# Patient Record
Sex: Female | Born: 1985 | Race: White | Hispanic: No | Marital: Single | Smoking: Current every day smoker
Health system: Southern US, Community
[De-identification: ages and names within clinical notes are randomized; demographics above are authoritative.]

## PROBLEM LIST (undated history)

## (undated) ENCOUNTER — Emergency Department (HOSPITAL_COMMUNITY): Discharge status: Absent without leave | Payer: BLUE CROSS/BLUE SHIELD

## (undated) DIAGNOSIS — E119 Type 2 diabetes mellitus without complications: Secondary | ICD-10-CM

## (undated) DIAGNOSIS — K5909 Other constipation: Secondary | ICD-10-CM

## (undated) DIAGNOSIS — K219 Gastro-esophageal reflux disease without esophagitis: Secondary | ICD-10-CM

## (undated) DIAGNOSIS — E7800 Pure hypercholesterolemia, unspecified: Secondary | ICD-10-CM

## (undated) DIAGNOSIS — I10 Essential (primary) hypertension: Secondary | ICD-10-CM

## (undated) HISTORY — DX: Other constipation: K59.09

## (undated) HISTORY — PX: APPENDECTOMY: SHX54

## (undated) HISTORY — DX: Gastro-esophageal reflux disease without esophagitis: K21.9

## (undated) HISTORY — DX: Type 2 diabetes mellitus without complications: E11.9

---
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
## 2006-07-20 ENCOUNTER — Emergency Department (HOSPITAL_COMMUNITY): Admission: EM | Admit: 2006-07-20 | Discharge: 2006-07-20 | Payer: Self-pay | Admitting: Emergency Medicine

## 2007-04-25 ENCOUNTER — Emergency Department (HOSPITAL_COMMUNITY): Admission: EM | Admit: 2007-04-25 | Discharge: 2007-04-25 | Payer: Self-pay | Admitting: Emergency Medicine

## 2007-07-19 ENCOUNTER — Emergency Department (HOSPITAL_COMMUNITY): Admission: EM | Admit: 2007-07-19 | Discharge: 2007-07-19 | Payer: Self-pay | Admitting: Emergency Medicine

## 2007-10-13 ENCOUNTER — Emergency Department (HOSPITAL_COMMUNITY): Admission: EM | Admit: 2007-10-13 | Discharge: 2007-10-13 | Payer: Self-pay | Admitting: Emergency Medicine

## 2007-12-27 ENCOUNTER — Emergency Department (HOSPITAL_COMMUNITY): Admission: EM | Admit: 2007-12-27 | Discharge: 2007-12-27 | Payer: Self-pay | Admitting: Emergency Medicine

## 2008-03-23 ENCOUNTER — Emergency Department (HOSPITAL_COMMUNITY): Admission: EM | Admit: 2008-03-23 | Discharge: 2008-03-23 | Payer: Self-pay | Admitting: Emergency Medicine

## 2008-09-27 ENCOUNTER — Emergency Department (HOSPITAL_COMMUNITY): Admission: EM | Admit: 2008-09-27 | Discharge: 2008-09-27 | Payer: Self-pay | Admitting: Emergency Medicine

## 2009-02-08 ENCOUNTER — Emergency Department (HOSPITAL_COMMUNITY): Admission: EM | Admit: 2009-02-08 | Discharge: 2009-02-08 | Payer: Self-pay | Admitting: Emergency Medicine

## 2009-11-09 ENCOUNTER — Emergency Department (HOSPITAL_COMMUNITY): Admission: EM | Admit: 2009-11-09 | Discharge: 2009-11-09 | Payer: Self-pay | Admitting: Emergency Medicine

## 2010-05-27 ENCOUNTER — Encounter: Payer: Self-pay | Admitting: Internal Medicine

## 2010-06-28 ENCOUNTER — Emergency Department (HOSPITAL_COMMUNITY)
Admission: EM | Admit: 2010-06-28 | Discharge: 2010-06-28 | Disposition: A | Discharge status: Home / Self Care | Payer: Self-pay | Attending: Emergency Medicine | Admitting: Emergency Medicine

## 2010-06-28 ENCOUNTER — Emergency Department (HOSPITAL_COMMUNITY): Payer: Self-pay

## 2010-06-28 DIAGNOSIS — R51 Headache: Secondary | ICD-10-CM | POA: Insufficient documentation

## 2010-06-28 DIAGNOSIS — R42 Dizziness and giddiness: Secondary | ICD-10-CM | POA: Insufficient documentation

## 2010-08-10 LAB — URINALYSIS, ROUTINE W REFLEX MICROSCOPIC
Glucose, UA: NEGATIVE mg/dL
Specific Gravity, Urine: 1.025 (ref 1.005–1.030)

## 2010-08-10 LAB — URINE CULTURE

## 2010-08-10 LAB — URINE MICROSCOPIC-ADD ON

## 2011-01-29 LAB — URINALYSIS, ROUTINE W REFLEX MICROSCOPIC
Glucose, UA: NEGATIVE
Hgb urine dipstick: NEGATIVE
Specific Gravity, Urine: 1.025
Urobilinogen, UA: 0.2

## 2011-01-29 LAB — URINE MICROSCOPIC-ADD ON

## 2011-01-29 LAB — URINE CULTURE

## 2011-02-09 LAB — COMPREHENSIVE METABOLIC PANEL
ALT: 27
AST: 19
Albumin: 3.6
Alkaline Phosphatase: 43
BUN: 10
CO2: 28
Calcium: 8.7
Chloride: 104
Creatinine, Ser: 0.7
GFR calc Af Amer: >60
GFR calc non Af Amer: >60
Glucose, Bld: 97
Potassium: 3.8
Sodium: 137
Total Bilirubin: 0.6
Total Protein: 6.6

## 2011-02-09 LAB — DIFFERENTIAL
Basophils Relative: 0
Eosinophils Absolute: 0.4
Eosinophils Relative: 4
Neutrophils Relative %: 58

## 2011-02-09 LAB — LIPASE, BLOOD: Lipase: 48

## 2011-02-09 LAB — URINALYSIS, ROUTINE W REFLEX MICROSCOPIC
Bilirubin Urine: NEGATIVE
Nitrite: NEGATIVE
Specific Gravity, Urine: 1.01
pH: 6.5

## 2011-02-09 LAB — CBC
Hemoglobin: 13.6
MCHC: 34.4
RBC: 4.54
WBC: 10.4

## 2011-02-09 LAB — PREGNANCY, URINE: Preg Test, Ur: NEGATIVE

## 2011-04-12 ENCOUNTER — Other Ambulatory Visit: Payer: Self-pay

## 2011-04-12 ENCOUNTER — Emergency Department (HOSPITAL_COMMUNITY)
Admission: EM | Admit: 2011-04-12 | Discharge: 2011-04-12 | Disposition: A | Discharge status: Home / Self Care | Payer: Self-pay | Attending: Emergency Medicine | Admitting: Emergency Medicine

## 2011-04-12 ENCOUNTER — Emergency Department (HOSPITAL_COMMUNITY): Payer: Self-pay

## 2011-04-12 ENCOUNTER — Encounter: Payer: Self-pay | Admitting: *Deleted

## 2011-04-12 DIAGNOSIS — B9789 Other viral agents as the cause of diseases classified elsewhere: Secondary | ICD-10-CM | POA: Insufficient documentation

## 2011-04-12 DIAGNOSIS — B349 Viral infection, unspecified: Secondary | ICD-10-CM

## 2011-04-12 DIAGNOSIS — Z87891 Personal history of nicotine dependence: Secondary | ICD-10-CM | POA: Insufficient documentation

## 2011-04-12 DIAGNOSIS — I10 Essential (primary) hypertension: Secondary | ICD-10-CM | POA: Insufficient documentation

## 2011-04-12 DIAGNOSIS — R079 Chest pain, unspecified: Secondary | ICD-10-CM | POA: Insufficient documentation

## 2011-04-12 DIAGNOSIS — R0602 Shortness of breath: Secondary | ICD-10-CM | POA: Insufficient documentation

## 2011-04-12 HISTORY — DX: Essential (primary) hypertension: I10

## 2011-04-12 LAB — POCT I-STAT, CHEM 8
Calcium, Ion: 1.13 mmol/L (ref 1.12–1.32)
Chloride: 101 mEq/L (ref 96–112)
Creatinine, Ser: 0.8 mg/dL (ref 0.50–1.10)
Glucose, Bld: 111 mg/dL — ABNORMAL HIGH (ref 70–99)
HCT: 46 % (ref 36.0–46.0)
Potassium: 4.2 mEq/L (ref 3.5–5.1)

## 2011-04-12 LAB — URINE MICROSCOPIC-ADD ON

## 2011-04-12 LAB — URINALYSIS, ROUTINE W REFLEX MICROSCOPIC
Bilirubin Urine: NEGATIVE
Glucose, UA: NEGATIVE mg/dL
Ketones, ur: NEGATIVE mg/dL
Nitrite: NEGATIVE
Protein, ur: NEGATIVE mg/dL
pH: 7 (ref 5.0–8.0)

## 2011-04-12 LAB — POCT PREGNANCY, URINE: Preg Test, Ur: NEGATIVE

## 2011-04-12 MED ORDER — OXYCODONE-ACETAMINOPHEN 5-325 MG PO TABS: 2.0000 | ORAL_TABLET | ORAL | Status: AC | PRN

## 2011-04-12 MED ORDER — PROMETHAZINE HCL 25 MG PO TABS: 25.0000 mg | ORAL_TABLET | Freq: Four times a day (QID) | ORAL | Status: DC | PRN

## 2011-04-12 NOTE — ED Provider Notes (Signed)
History     CSN: [id number] Arrival date & time: 04/12/2011 12:22 PM   First MD Initiated Contact with Patient 04/12/11 1308      Chief Complaint  Patient presents with  . Shortness of Breath  . Chest Pain    (Consider location/radiation/quality/duration/timing/severity/associated sxs/prior treatment) Patient is a 25 y.o. female presenting with shortness of breath and chest pain. The history is provided by the patient.  Shortness of Breath  Associated symptoms include chest pain and shortness of breath. Pertinent negatives include no fever, no sore throat and no cough.  Chest Pain Primary symptoms include shortness of breath and nausea. Pertinent negatives for primary symptoms include no fever, no cough and no vomiting.  Associated symptoms include diaphoresis.    the patient is a 25 year old morbidly obese female, who has hypertension.  She smoked until about a month ago and is on lisinopril hydrochlorothiazide, and oral contraceptive pills.  She complains of intermittent chest pain, and shortness of breath for several days.  She has no chest pain, or shortness breath.  Now.  She also complains of a hot sensation with sweaty palms and nausea.  She denies , vomiting, fevers, chills, cough, urinary tract symptoms.  She has not had URI symptoms.  She denies leg pain or swelling.  She does not want any medications at this time.  Past Medical History  Diagnosis Date  . Hypertension     Past Surgical History  Procedure Date  . Appendectomy     History reviewed. No pertinent family history.  History  Substance Use Topics  . Smoking status: Current Everyday Smoker  . Smokeless tobacco: Not on file  . Alcohol Use: No    OB History    Grav Para Term Preterm Abortions TAB SAB Ect Mult Living                  Review of Systems  Constitutional: Positive for diaphoresis. Negative for fever and chills.  HENT: Negative for ear pain, congestion, sore throat and trouble swallowing.    Eyes: Negative for redness.  Respiratory: Positive for chest tightness and shortness of breath. Negative for cough.   Cardiovascular: Positive for chest pain.  Gastrointestinal: Positive for nausea. Negative for vomiting, diarrhea and constipation.  Genitourinary: Negative for dysuria and hematuria.  Musculoskeletal: Negative for back pain.  Neurological: Negative for light-headedness and headaches.  Hematological: Does not bruise/bleed easily.  Psychiatric/Behavioral: Negative for confusion.    Allergies  Review of patient's allergies indicates no known allergies.  Home Medications   Current Outpatient Rx  Name Route Sig Dispense Refill  . ACETAMINOPHEN 500 MG PO TABS Oral Take 500 mg by mouth every 6 (six) hours as needed. For pain     . LISINOPRIL-HYDROCHLOROTHIAZIDE 10-12.5 MG PO TABS Oral Take 1 tablet by mouth daily.      Marland Kitchen RANITIDINE HCL 150 MG PO TABS Oral Take 150 mg by mouth 2 (two) times daily.        BP 131/79  Pulse 106  Temp(Src) 98.3 F (36.8 C) (Oral)  Resp 18  Ht 5\' 2"  (1.575 m)  Wt 200 lb (90.719 kg)  BMI 36.58 kg/m2  SpO2 100%  Physical Exam  Vitals reviewed. Constitutional: She is oriented to person, place, and time. No distress.       Morbidly obese  HENT:  Head: Normocephalic and atraumatic.  Right Ear: External ear normal.  Left Ear: External ear normal.  Mouth/Throat: Oropharynx is clear and moist. No oropharyngeal exudate.  Eyes: Conjunctivae are normal. Pupils are equal, round, and reactive to light.  Neck: Normal range of motion.  Cardiovascular: Regular rhythm and normal heart sounds.   No murmur heard.      Tachycardia  Pulmonary/Chest: Effort normal and breath sounds normal. No respiratory distress. She has no wheezes. She has no rales.  Abdominal: Soft. She exhibits no distension and no mass. There is no tenderness. There is no rebound and no guarding.  Musculoskeletal: Normal range of motion. She exhibits no edema and no tenderness.    Neurological: She is alert and oriented to person, place, and time. No cranial nerve deficit.  Skin: Skin is warm and dry. No rash noted. No erythema.  Psychiatric: She has a normal mood and affect. Her behavior is normal.    ED Course  Procedures (including critical care time) 25 year old female, with history of hypertension, presents with shortness of breath, and intermittent chest pain.  She is on birth control pills and used to smoke.  She has a resting tachycardia.  But otherwise she does not appear to be acutely ill.  I will perform a chest x-ray, EKG, blood chemistry, and a d-dimer, since.  She has shortness breath, resting tachycardia, and intermittent chest pain.  She does not want any medical treatment at this time.  There is no indication for acute intervention.  Labs Reviewed  POCT I-STAT, CHEM 8 - Abnormal; Notable for the following:    Glucose, Bld 111 (*)    Hemoglobin 15.6 (*)    All other components within normal limits  I-STAT, CHEM 8  URINALYSIS, ROUTINE W REFLEX MICROSCOPIC  D-DIMER, QUANTITATIVE     ED ECG REPORT   Date: 04/12/2011  EKG Time: 2:08 PM  Rate: 96  Rhythm: normal sinus rhythm,  normal EKG, normal sinus rhythm  Axis: nl  Intervals:none  ST&T Change: none  Narrative Interpretation: Normal sinus rhythm     5:18 PM Feels btter        MDM  Viral infection No signs of toxicity pneumonia, pulmonary embolism, or other severe illness.  Since she has no urinary tract symptoms, and there are many epithelial cells on her urinalysis.  I doubt that she has a urinary tract infection, so antibiotics will not be prescribed.        Nicholes Stairs, MD 04/12/11 1719

## 2011-04-12 NOTE — ED Notes (Signed)
Pt [facility] and was prescribed some BP medication. States she has taken the BP medication for 2 days and yesterday she began to feel very short of breath and nauseated. Pt states she still has nausea, denies any vomiting. Pt appears anxious. Pt breathing regular.  No acute distress noted.

## 2011-04-12 NOTE — ED Notes (Signed)
Pt c/o intermittent, recurrent chest pain; pt states she went to the health department and was started on a new med for BP and states she has been feeling dizzy, with hot and cold chills and chest pain

## 2011-06-13 ENCOUNTER — Emergency Department (HOSPITAL_COMMUNITY)
Admission: EM | Admit: 2011-06-13 | Discharge: 2011-06-13 | Disposition: A | Discharge status: Home / Self Care | Payer: Self-pay | Attending: Emergency Medicine | Admitting: Emergency Medicine

## 2011-06-13 ENCOUNTER — Encounter (HOSPITAL_COMMUNITY): Payer: Self-pay | Admitting: *Deleted

## 2011-06-13 DIAGNOSIS — N390 Urinary tract infection, site not specified: Secondary | ICD-10-CM | POA: Insufficient documentation

## 2011-06-13 DIAGNOSIS — R350 Frequency of micturition: Secondary | ICD-10-CM | POA: Insufficient documentation

## 2011-06-13 DIAGNOSIS — I10 Essential (primary) hypertension: Secondary | ICD-10-CM | POA: Insufficient documentation

## 2011-06-13 DIAGNOSIS — R10819 Abdominal tenderness, unspecified site: Secondary | ICD-10-CM | POA: Insufficient documentation

## 2011-06-13 DIAGNOSIS — R300 Dysuria: Secondary | ICD-10-CM | POA: Insufficient documentation

## 2011-06-13 LAB — URINALYSIS, ROUTINE W REFLEX MICROSCOPIC
Bilirubin Urine: NEGATIVE
Ketones, ur: NEGATIVE mg/dL
Specific Gravity, Urine: 1.02 (ref 1.005–1.030)
Urobilinogen, UA: 0.2 mg/dL (ref 0.0–1.0)
pH: 6.5 (ref 5.0–8.0)

## 2011-06-13 LAB — URINE MICROSCOPIC-ADD ON

## 2011-06-13 MED ORDER — NITROFURANTOIN MONOHYD MACRO 100 MG PO CAPS: 100.0000 mg | ORAL_CAPSULE | Freq: Two times a day (BID) | ORAL | Status: AC

## 2011-06-13 MED ORDER — NITROFURANTOIN MONOHYD MACRO 100 MG PO CAPS
100.0000 mg | ORAL_CAPSULE | Freq: Once | ORAL | Status: AC
  Administered 2011-06-13: 100 mg via ORAL
  Filled 2011-06-13: qty 1

## 2011-06-13 MED ORDER — PHENAZOPYRIDINE HCL 100 MG PO TABS
200.0000 mg | ORAL_TABLET | Freq: Three times a day (TID) | ORAL | Status: DC
  Administered 2011-06-13: 200 mg via ORAL
  Filled 2011-06-13: qty 2

## 2011-06-13 MED ORDER — PHENAZOPYRIDINE HCL 200 MG PO TABS: 200.0000 mg | ORAL_TABLET | Freq: Three times a day (TID) | ORAL | Status: AC

## 2011-06-13 NOTE — ED Notes (Signed)
Pt states has had urinary frequency, burning with urination, and urgency x 1 week. Pt c/o vaginal pain and burning.

## 2011-06-13 NOTE — ED Notes (Signed)
Pt c/o urinary frequency, urgency, and burning on urination x 1 week. Also c/o swelling in left ankle for a couple years. Denies injury.

## 2011-06-13 NOTE — ED Provider Notes (Signed)
History     CSN: [id number]  Arrival date & time 06/13/11  1617   None     Chief Complaint  Patient presents with  . Urinary Frequency    (Consider location/radiation/quality/duration/timing/severity/associated sxs/prior treatment) HPI Comments: Urinary frequency and dysuria.  No hematuria or urgency.  No fever or CVA pain  Patient is a 26 y.o. female presenting with frequency. The history is provided by the patient. No language interpreter was used.  Urinary Frequency This is a new problem. Episode onset: 2-3 days ago. The problem occurs constantly. The problem has been unchanged. Associated symptoms include urinary symptoms. Pertinent negatives include no chills, fever, nausea, vomiting or weakness.    Past Medical History  Diagnosis Date  . Hypertension     Past Surgical History  Procedure Date  . Appendectomy     History reviewed. No pertinent family history.  History  Substance Use Topics  . Smoking status: Current Everyday Smoker  . Smokeless tobacco: Not on file  . Alcohol Use: Yes     occasionally    OB History    Grav Para Term Preterm Abortions TAB SAB Ect Mult Living                  Review of Systems  Constitutional: Negative for fever and chills.  Gastrointestinal: Negative for nausea and vomiting.  Genitourinary: Positive for dysuria and frequency. Negative for urgency, hematuria and flank pain.  Musculoskeletal: Negative for back pain.  Neurological: Negative for weakness.  All other systems reviewed and are negative.    Allergies  Review of patient's allergies indicates no known allergies.  Home Medications   Current Outpatient Rx  Name Route Sig Dispense Refill  . BC HEADACHE POWDER PO Oral Take 1 packet by mouth as needed. For pain    . RANITIDINE HCL 150 MG PO TABS Oral Take 150 mg by mouth 2 (two) times daily.      . ACETAMINOPHEN 500 MG PO TABS Oral Take 500 mg by mouth every 6 (six) hours as needed. For pain       BP 148/104   Pulse 102  Temp(Src) 98 F (36.7 C) (Oral)  Resp 20  Ht 5\' 3"  (1.6 m)  Wt 180 lb (81.647 kg)  BMI 31.89 kg/m2  SpO2 100%  Physical Exam  Nursing note and vitals reviewed. Constitutional: She is oriented to person, place, and time. She appears well-developed and well-nourished. No distress.  HENT:  Head: Normocephalic and atraumatic.  Eyes: EOM are normal.  Neck: Normal range of motion.  Cardiovascular: Normal rate, regular rhythm and normal heart sounds.   Pulmonary/Chest: Effort normal and breath sounds normal.  Abdominal: Soft. She exhibits no distension and no mass. There is tenderness in the suprapubic area. There is no rigidity, no rebound, no guarding, no CVA tenderness, no tenderness at McBurney's point and negative Murphy's sign.    Genitourinary: No vaginal discharge found.  Musculoskeletal: Normal range of motion.  Neurological: She is alert and oriented to person, place, and time.  Skin: Skin is warm and dry.  Psychiatric: She has a normal mood and affect. Judgment normal.    ED Course  Procedures (including critical care time)   Labs Reviewed  URINALYSIS, ROUTINE W REFLEX MICROSCOPIC   No results found.   No diagnosis found.    MDM          Worthy Rancher, PA 06/13/11 1754

## 2011-06-14 NOTE — ED Provider Notes (Signed)
Medical screening examination/treatment/procedure(s) were performed by non-physician practitioner and as supervising physician I was immediately available for consultation/collaboration.  Nakaya Mishkin S. Noeli Lavery, MD 06/14/11 1505 

## 2011-06-15 LAB — URINE CULTURE

## 2011-06-16 NOTE — ED Notes (Addendum)
+  Urine °Chart sent to EDP office for review; no sensitivities listed °

## 2011-06-19 NOTE — ED Notes (Signed)
No treatment,it is a contaminant per [place].

## 2012-05-02 ENCOUNTER — Encounter (HOSPITAL_COMMUNITY): Payer: Self-pay | Admitting: *Deleted

## 2012-05-02 ENCOUNTER — Emergency Department (HOSPITAL_COMMUNITY)
Admission: EM | Admit: 2012-05-02 | Discharge: 2012-05-02 | Disposition: A | Discharge status: Home / Self Care | Payer: Self-pay | Attending: Emergency Medicine | Admitting: Emergency Medicine

## 2012-05-02 DIAGNOSIS — R0602 Shortness of breath: Secondary | ICD-10-CM | POA: Insufficient documentation

## 2012-05-02 DIAGNOSIS — F17200 Nicotine dependence, unspecified, uncomplicated: Secondary | ICD-10-CM | POA: Insufficient documentation

## 2012-05-02 DIAGNOSIS — Z79899 Other long term (current) drug therapy: Secondary | ICD-10-CM | POA: Insufficient documentation

## 2012-05-02 DIAGNOSIS — R209 Unspecified disturbances of skin sensation: Secondary | ICD-10-CM | POA: Insufficient documentation

## 2012-05-02 DIAGNOSIS — R079 Chest pain, unspecified: Secondary | ICD-10-CM | POA: Insufficient documentation

## 2012-05-02 DIAGNOSIS — I10 Essential (primary) hypertension: Secondary | ICD-10-CM | POA: Insufficient documentation

## 2012-05-02 DIAGNOSIS — Z3202 Encounter for pregnancy test, result negative: Secondary | ICD-10-CM | POA: Insufficient documentation

## 2012-05-02 LAB — BASIC METABOLIC PANEL
BUN: 8 mg/dL (ref 6–23)
CO2: 30 mEq/L (ref 19–32)
Chloride: 103 mEq/L (ref 96–112)
Creatinine, Ser: 0.8 mg/dL (ref 0.50–1.10)
Glucose, Bld: 105 mg/dL — ABNORMAL HIGH (ref 70–99)

## 2012-05-02 LAB — HEPATIC FUNCTION PANEL
ALT: 47 U/L — ABNORMAL HIGH (ref 0–35)
AST: 27 U/L (ref 0–37)
Albumin: 4.1 g/dL (ref 3.5–5.2)
Total Bilirubin: 0.4 mg/dL (ref 0.3–1.2)

## 2012-05-02 LAB — URINALYSIS, ROUTINE W REFLEX MICROSCOPIC
Bilirubin Urine: NEGATIVE
Glucose, UA: NEGATIVE mg/dL
Hgb urine dipstick: NEGATIVE
Ketones, ur: NEGATIVE mg/dL
Leukocytes, UA: NEGATIVE
pH: 7.5 (ref 5.0–8.0)

## 2012-05-02 LAB — CBC
HCT: 43.7 % (ref 36.0–46.0)
MCH: 30 pg (ref 26.0–34.0)
MCV: 88.5 fL (ref 78.0–100.0)
RBC: 4.94 MIL/uL (ref 3.87–5.11)
WBC: 9.5 10*3/uL (ref 4.0–10.5)

## 2012-05-02 MED ORDER — LISINOPRIL 20 MG PO TABS: 20.0000 mg | ORAL_TABLET | Freq: Every day | ORAL | Status: DC

## 2012-05-02 NOTE — ED Notes (Signed)
Pt states CP and sob. Ran out of BP pills a few mo ago. Pain to entire jaw area and tingling to hands. States sob but in no distress. Symptoms began a few days ago.

## 2012-05-02 NOTE — ED Provider Notes (Signed)
History    This chart was scribed for Dawn Lennert, MD, MD by Smitty Pluck, ED Scribe. The patient was seen in room APA19 and the patient's care was started at 1:25 PM.   CSN: [id number]  Arrival date & time 05/02/12  1239        Chief Complaint  Patient presents with  . Chest Pain    (Consider location/radiation/quality/duration/timing/severity/associated sxs/prior treatment) Patient is a 26 y.o. female presenting with chest pain. The history is provided by the patient.  Chest Pain The chest pain began 3 - 5 days ago. Chest pain occurs intermittently. The chest pain is unchanged. The severity of the pain is moderate. The pain does not radiate. Primary symptoms include shortness of breath. Pertinent negatives for primary symptoms include no fatigue, no cough and no abdominal pain.  The shortness of breath began more than 2 days ago. The shortness of breath is mild.  Associated symptoms include numbness.  Pertinent negatives for associated symptoms include no diaphoresis. She tried nothing for the symptoms.  Pertinent negatives for past medical history include no seizures.    MERCADIES CO is a 26 y.o. female who presents to the Emergency Department complaining of intermittent, moderate chest pain . Pt reports that she has intermittent numbness and tingling sensation in fingers. She states she has mild SOB. Pt reports that she ran out of BP a few months ago. She does not remember the name of the medication. She denies radiation of pain, diaphoresis and any other pain.    Past Medical History  Diagnosis Date  . Hypertension     Past Surgical History  Procedure Date  . Appendectomy     No family history on file.  History  Substance Use Topics  . Smoking status: Current Every Day Smoker  . Smokeless tobacco: Not on file  . Alcohol Use: Yes     Comment: occasionally    OB History    Grav Para Term Preterm Abortions TAB SAB Ect Mult Living                   Review of Systems  Constitutional: Negative for diaphoresis and fatigue.  HENT: Negative for congestion, sinus pressure and ear discharge.   Eyes: Negative for discharge.  Respiratory: Positive for shortness of breath. Negative for cough.   Cardiovascular: Positive for chest pain.  Gastrointestinal: Negative for abdominal pain and diarrhea.  Genitourinary: Negative for frequency and hematuria.  Musculoskeletal: Negative for back pain.  Skin: Negative for rash.  Neurological: Positive for numbness. Negative for seizures and headaches.  Hematological: Negative.   Psychiatric/Behavioral: Negative for hallucinations.  All other systems reviewed and are negative.    Allergies  Review of patient's allergies indicates no known allergies.  Home Medications   Current Outpatient Rx  Name  Route  Sig  Dispense  Refill  . ACETAMINOPHEN 500 MG PO TABS   Oral   Take 500 mg by mouth every 6 (six) hours as needed. For pain          . BC HEADACHE POWDER PO   Oral   Take 1 packet by mouth as needed. For pain         . RANITIDINE HCL 150 MG PO TABS   Oral   Take 150 mg by mouth 2 (two) times daily.             BP 170/104  Pulse 84  Temp 97.9 F (36.6 C) (Oral)  Resp 20  Ht 5\' 2"  (1.575 m)  SpO2 100%  Physical Exam  Nursing note and vitals reviewed. Constitutional: She is oriented to person, place, and time. She appears well-developed.  HENT:  Head: Normocephalic and atraumatic.  Eyes: Conjunctivae normal and EOM are normal. No scleral icterus.  Neck: Neck supple. No thyromegaly present.  Cardiovascular: Normal rate and regular rhythm.  Exam reveals no gallop and no friction rub.   No murmur heard. Pulmonary/Chest: No stridor. She has no wheezes. She has no rales. She exhibits no tenderness.  Abdominal: She exhibits no distension. There is no tenderness. There is no rebound.  Musculoskeletal: Normal range of motion. She exhibits no edema.  Lymphadenopathy:    She  has no cervical adenopathy.  Neurological: She is oriented to person, place, and time. Coordination normal.  Skin: No rash noted. No erythema.  Psychiatric: She has a normal mood and affect. Her behavior is normal.    ED Course  Procedures (including critical care time) DIAGNOSTIC STUDIES: Oxygen Saturation is 100% on room air, normal by my interpretation.    COORDINATION OF CARE: 1:28 PM Discussed ED treatment with pt     Labs Reviewed  BASIC METABOLIC PANEL - Abnormal; Notable for the following:    Glucose, Bld 105 (*)     All other components within normal limits  HEPATIC FUNCTION PANEL - Abnormal; Notable for the following:    ALT 47 (*)     All other components within normal limits  TROPONIN I  CBC  URINALYSIS, ROUTINE W REFLEX MICROSCOPIC  PREGNANCY, URINE   No results found.   No diagnosis found.  Date: 05/02/2012  Rate: 78  Rhythm: normal sinus rhythm  QRS Axis: normal  Intervals: normal  ST/T Wave abnormalities: normal  Conduction Disutrbances:none  Narrative Interpretation:   Old EKG Reviewed: none available     MDM     The chart was scribed for me under my direct supervision.  I personally performed the history, physical, and medical decision making and all procedures in the evaluation of this patient.Dawn Lennert, MD 05/02/12 [phone]

## 2012-05-06 ENCOUNTER — Emergency Department (HOSPITAL_COMMUNITY)
Admission: EM | Admit: 2012-05-06 | Discharge: 2012-05-06 | Disposition: A | Discharge status: Home / Self Care | Payer: Self-pay | Attending: Emergency Medicine | Admitting: Emergency Medicine

## 2012-05-06 ENCOUNTER — Encounter (HOSPITAL_COMMUNITY): Payer: Self-pay

## 2012-05-06 DIAGNOSIS — I10 Essential (primary) hypertension: Secondary | ICD-10-CM | POA: Insufficient documentation

## 2012-05-06 DIAGNOSIS — R238 Other skin changes: Secondary | ICD-10-CM | POA: Insufficient documentation

## 2012-05-06 DIAGNOSIS — F17200 Nicotine dependence, unspecified, uncomplicated: Secondary | ICD-10-CM | POA: Insufficient documentation

## 2012-05-06 DIAGNOSIS — R112 Nausea with vomiting, unspecified: Secondary | ICD-10-CM | POA: Insufficient documentation

## 2012-05-06 MED ORDER — ONDANSETRON 4 MG PO TBDP: 4.0000 mg | ORAL_TABLET | Freq: Three times a day (TID) | ORAL | Status: DC | PRN

## 2012-05-06 MED ORDER — ONDANSETRON HCL 4 MG/2ML IJ SOLN
8.0000 mg | Freq: Once | INTRAMUSCULAR | Status: AC
  Administered 2012-05-06: 8 mg via INTRAMUSCULAR
  Filled 2012-05-06: qty 4

## 2012-05-06 NOTE — ED Provider Notes (Signed)
Medical screening examination/treatment/procedure(s) were performed by non-physician practitioner and as supervising physician I was immediately available for consultation/collaboration.   Flint Melter, MD 05/06/12 [phone]

## 2012-05-06 NOTE — ED Provider Notes (Signed)
History     CSN: [id number]  Arrival date & time 05/06/12  0865   First MD Initiated Contact with Patient 05/06/12 [phone]      Chief Complaint  Patient presents with  . Emesis    (Consider location/radiation/quality/duration/timing/severity/associated sxs/prior treatment) HPI Comments: Here 4 days ago with CP and SOB.  Denies any of those sxs.  Prescribed lisinopril 20 mg QD on 05-02-13.  Has not taken today's dose.  Has no antiemetic meds at home.  Pt of [facility].  Patient is a 26 y.o. female presenting with vomiting. The history is provided by the patient. No language interpreter was used.  Emesis  This is a new problem. Episode onset: 0630 today. Episode frequency: 6 episodes of vomiting.  no diarrhea.  no meds for sxs. The emesis has an appearance of bilious material. There has been no fever. Pertinent negatives include no abdominal pain, no chills, no cough, no diarrhea and no fever.    Past Medical History  Diagnosis Date  . Hypertension     Past Surgical History  Procedure Date  . Appendectomy     No family history on file.  History  Substance Use Topics  . Smoking status: Current Every Day Smoker  . Smokeless tobacco: Not on file  . Alcohol Use: Yes     Comment: occasionally    OB History    Grav Para Term Preterm Abortions TAB SAB Ect Mult Living                  Review of Systems  Constitutional: Negative for fever and chills.  Respiratory: Negative for cough and shortness of breath.   Cardiovascular: Negative for chest pain.  Gastrointestinal: Positive for nausea and vomiting. Negative for abdominal pain and diarrhea.  All other systems reviewed and are negative.    Allergies  Review of patient's allergies indicates no known allergies.  Home Medications   Current Outpatient Rx  Name  Route  Sig  Dispense  Refill  . LISINOPRIL 20 MG PO TABS   Oral   Take 1 tablet (20 mg total) by mouth daily.   30 tablet   1   . ONDANSETRON 4 MG PO TBDP  Oral   Take 1 tablet (4 mg total) by mouth every 8 (eight) hours as needed for nausea.   10 tablet   0     BP 146/104  Pulse 85  Temp 97.9 F (36.6 C) (Oral)  Resp 20  Ht 5\' 2"  (1.575 m)  Wt 165 lb (74.844 kg)  BMI 30.18 kg/m2  SpO2 100%  Physical Exam  Nursing note and vitals reviewed. Constitutional: She is oriented to person, place, and time. She appears well-developed and well-nourished. No distress.  HENT:  Head: Normocephalic and atraumatic.  Eyes: EOM are normal.  Neck: Normal range of motion.  Cardiovascular: Normal rate, regular rhythm and normal heart sounds.   Pulmonary/Chest: Effort normal and breath sounds normal. No respiratory distress. She has no wheezes. She has no rales. She exhibits no tenderness.    Abdominal: Soft. Bowel sounds are normal. She exhibits no distension. There is no tenderness. There is no rebound and no guarding.  Musculoskeletal: Normal range of motion.  Neurological: She is alert and oriented to person, place, and time.  Skin: Skin is warm and dry.  Psychiatric: She has a normal mood and affect. Judgment normal.    ED Course  Procedures (including critical care time)  Labs Reviewed - No data to display No  results found.   1. Nausea and vomiting       MDM  [id number] 4 mg ODT F/u at [facility] prn        Evalina Field, PA 05/06/12 [phone]

## 2012-05-06 NOTE — ED Notes (Signed)
Complain of n/v since 0630 this morning. Also, states she has a rash under her right breast that has been there a couple of days.

## 2012-07-15 ENCOUNTER — Emergency Department (HOSPITAL_COMMUNITY)
Admission: EM | Admit: 2012-07-15 | Discharge: 2012-07-15 | Disposition: A | Discharge status: Home / Self Care | Payer: Self-pay | Attending: Emergency Medicine | Admitting: Emergency Medicine

## 2012-07-15 ENCOUNTER — Emergency Department (HOSPITAL_COMMUNITY): Payer: Self-pay

## 2012-07-15 ENCOUNTER — Encounter (HOSPITAL_COMMUNITY): Payer: Self-pay | Admitting: *Deleted

## 2012-07-15 DIAGNOSIS — I10 Essential (primary) hypertension: Secondary | ICD-10-CM | POA: Insufficient documentation

## 2012-07-15 DIAGNOSIS — R112 Nausea with vomiting, unspecified: Secondary | ICD-10-CM | POA: Insufficient documentation

## 2012-07-15 DIAGNOSIS — R142 Eructation: Secondary | ICD-10-CM | POA: Insufficient documentation

## 2012-07-15 DIAGNOSIS — F17200 Nicotine dependence, unspecified, uncomplicated: Secondary | ICD-10-CM | POA: Insufficient documentation

## 2012-07-15 DIAGNOSIS — R141 Gas pain: Secondary | ICD-10-CM | POA: Insufficient documentation

## 2012-07-15 DIAGNOSIS — R109 Unspecified abdominal pain: Secondary | ICD-10-CM | POA: Insufficient documentation

## 2012-07-15 DIAGNOSIS — K219 Gastro-esophageal reflux disease without esophagitis: Secondary | ICD-10-CM | POA: Insufficient documentation

## 2012-07-15 LAB — URINALYSIS, ROUTINE W REFLEX MICROSCOPIC
Bilirubin Urine: NEGATIVE
Glucose, UA: NEGATIVE mg/dL
Leukocytes, UA: NEGATIVE
Nitrite: NEGATIVE
Specific Gravity, Urine: 1.02 (ref 1.005–1.030)
pH: 8.5 — ABNORMAL HIGH (ref 5.0–8.0)

## 2012-07-15 LAB — COMPREHENSIVE METABOLIC PANEL
BUN: 11 mg/dL (ref 6–23)
Calcium: 9.3 mg/dL (ref 8.4–10.5)
GFR calc Af Amer: >90 mL/min (ref >90)
Glucose, Bld: 88 mg/dL (ref 70–99)
Sodium: 138 mEq/L (ref 135–145)
Total Protein: 7.1 g/dL (ref 6.0–8.3)

## 2012-07-15 LAB — CBC WITH DIFFERENTIAL/PLATELET
Eosinophils Absolute: 0.3 10*3/uL (ref 0.0–0.7)
Eosinophils Relative: 3 % (ref 0–5)
Lymphs Abs: 2.9 10*3/uL (ref 0.7–4.0)
MCH: 30.9 pg (ref 26.0–34.0)
MCV: 89.8 fL (ref 78.0–100.0)
Monocytes Relative: 8 % (ref 3–12)
Platelets: 301 10*3/uL (ref 150–400)
RBC: 4.5 MIL/uL (ref 3.87–5.11)

## 2012-07-15 LAB — LIPASE, BLOOD: Lipase: 60 U/L — ABNORMAL HIGH (ref 11–59)

## 2012-07-15 MED ORDER — PROMETHAZINE HCL 25 MG PO TABS: 25.0000 mg | ORAL_TABLET | Freq: Four times a day (QID) | ORAL | Status: DC | PRN

## 2012-07-15 MED ORDER — OMEPRAZOLE MAGNESIUM 20 MG PO TBEC: DELAYED_RELEASE_TABLET | ORAL | Status: DC

## 2012-07-15 NOTE — ED Notes (Signed)
Pt c/o abd pain for months, vomiting started 0600 this morning, pt states that her body feels like its on fire

## 2012-07-15 NOTE — ED Provider Notes (Signed)
History  This chart was scribed for Ward Givens, MD by Bennett Scrape, ED Scribe. This patient was seen in room APA09/APA09 and the patient's care was started at 1:37 PM.  CSN: [id number]  Arrival date & time 07/15/12  1308   First MD Initiated Contact with Patient 07/15/12 1337      Chief Complaint  Patient presents with  . Abdominal Pain  . Emesis     Patient is a 27 y.o. female presenting with abdominal pain. The history is provided by the patient. No language interpreter was used.  Abdominal Pain Pain location:  LUQ, RUQ and epigastric Pain quality: burning   Pain radiates to:  Chest Pain severity:  Moderate Onset quality:  Gradual Duration:  1 week Timing:  Intermittent Progression:  Worsening Chronicity:  Recurrent Relieved by:  Antacids Worsened by:  Eating Associated symptoms: belching, nausea and vomiting   Associated symptoms: no diarrhea and no fever     Dawn Ray is a 27 y.o. female who presents to the Emergency Department complaining of one week of intermittent episodes upper abdominal pain described as burning that radiates up into her chest lasting one hour at a time that has become constant for the past 2 days. She reports associated nausea and 2 episodes of emesis described as white contents "streaked with blood" with a burning sensation although she denies nausea currently. She states that she has been having frequent episodes of the same that she describes as ingestion with associated burping several hours after eating for the past months. She reports having increased nausea when laying down at night. She states that she has been taking ranitidine for the past month daily and has been decreasing the amount of fatty and greasy foods but states that "even a glass of water can trigger it". She denies feeling lightheaded but states sometimes she feels dizzy. She denies fever or weight loss. Pt is a 0.5 ppd smoker and occasional alcohol user but reports that  both activities make the pain worse. She is unsure about a family h/o indigestion. She denies prior pregnancy. She denies fevers and weight loss as associated symptoms.  Pt is seen at [facility]   Past Medical History  Diagnosis Date  . Hypertension     Past Surgical History  Procedure Laterality Date  . Appendectomy      History reviewed. No pertinent family history.  History  Substance Use Topics  . Smoking status: Current Every Day Smoker -- 0.50 packs/day    Types: Cigarettes  . Smokeless tobacco: Not on file  . Alcohol Use: Yes     Comment: occasionally  employed  No OB history provided.  Review of Systems  Constitutional: Negative for fever and unexpected weight change.  Gastrointestinal: Positive for nausea, vomiting and abdominal pain. Negative for diarrhea.  All other systems reviewed and are negative.    Allergies  Review of patient's allergies indicates no known allergies.  Home Medications   Current Outpatient Rx  Name  Route  Sig  Dispense  Refill  . lisinopril (PRINIVIL,ZESTRIL) 20 MG tablet   Oral   Take 1 tablet (20 mg total) by mouth daily.   30 tablet   1   . ondansetron (ZOFRAN ODT) 4 MG disintegrating tablet   Oral   Take 1 tablet (4 mg total) by mouth every 8 (eight) hours as needed for nausea.   10 tablet   0     Triage Vitals: BP 120/66  Pulse  94  Temp(Src) 98.1 F (36.7 C) (Oral)  Resp 18  Ht 5\' 3"  (1.6 m)  Wt 170 lb (77.111 kg)  BMI 30.12 kg/m2  SpO2 100%  Vital signs normal    Physical Exam  Nursing note and vitals reviewed. Constitutional: She is oriented to person, place, and time. She appears well-developed and well-nourished.  Non-toxic appearance. She does not appear ill. No distress.  HENT:  Head: Normocephalic and atraumatic.  Right Ear: External ear normal.  Left Ear: External ear normal.  Nose: Nose normal. No mucosal edema or rhinorrhea.  Mouth/Throat: Oropharynx is clear and moist  and mucous membranes are normal. No dental abscesses or edematous.  Eyes: Conjunctivae and EOM are normal. Pupils are equal, round, and reactive to light.  Neck: Normal range of motion and full passive range of motion without pain. Neck supple.  Cardiovascular: Normal rate, regular rhythm and normal heart sounds.  Exam reveals no gallop and no friction rub.   No murmur heard. Pulmonary/Chest: Effort normal and breath sounds normal. No respiratory distress. She has no wheezes. She has no rhonchi. She has no rales. She exhibits no tenderness and no crepitus.  Abdominal: Soft. Normal appearance and bowel sounds are normal. She exhibits no distension. There is tenderness (upper abdominal tenderness to palpation) in the right upper quadrant, epigastric area and left upper quadrant. There is no rebound and no guarding.    Area of pain noted  Musculoskeletal: Normal range of motion. She exhibits no edema and no tenderness.  Moves all extremities well.   Neurological: She is alert and oriented to person, place, and time. She has normal strength. No cranial nerve deficit.  Skin: Skin is warm, dry and intact. No rash noted. No erythema. No pallor.  Psychiatric: She has a normal mood and affect. Her speech is normal and behavior is normal. Her mood appears not anxious.    ED Course  Procedures (including critical care time)  Pt refused pain or nausea medication during her visit when asked  DIAGNOSTIC STUDIES: Oxygen Saturation is 100% on room air, normal by my interpretation.    COORDINATION OF CARE: 2:25 PM-Advised pt that her HR is borderline high and discussed the benefits of the IV. Pt declined the IV. Discussed treatment plan which includes [place] with pt at bedside and pt agreed to plan.   We discussed the results of her ultrasound specifically that she does not have gallstones but she does have evidence of inflammation of her gallbladder, and that she has a minor elevation of her lipase. We  discussed treatment for her reflux symptoms.  Results for orders placed during the hospital encounter of 07/15/12  CBC WITH DIFFERENTIAL      Result Value Range   WBC 9.7  4.0 - 10.5 K/uL   RBC 4.50  3.87 - 5.11 MIL/uL   Hemoglobin 13.9  12.0 - 15.0 g/dL   HCT 16.1  09.6 - 04.5 %   MCV 89.8  78.0 - 100.0 fL   MCH 30.9  26.0 - 34.0 pg   MCHC 34.4  30.0 - 36.0 g/dL   RDW 40.9  81.1 - 91.4 %   Platelets 301  150 - 400 K/uL   Neutrophils Relative 59  43 - 77 %   Neutro Abs 5.7  1.7 - 7.7 K/uL   Lymphocytes Relative 30  12 - 46 %   Lymphs Abs 2.9  0.7 - 4.0 K/uL   Monocytes Relative 8  3 - 12 %  Monocytes Absolute 0.8  0.1 - 1.0 K/uL   Eosinophils Relative 3  0 - 5 %   Eosinophils Absolute 0.3  0.0 - 0.7 K/uL   Basophils Relative 0  0 - 1 %   Basophils Absolute 0.0  0.0 - 0.1 K/uL  COMPREHENSIVE METABOLIC PANEL      Result Value Range   Sodium 138  135 - 145 mEq/L   Potassium 4.4  3.5 - 5.1 mEq/L   Chloride 102  96 - 112 mEq/L   CO2 28  19 - 32 mEq/L   Glucose, Bld 88  70 - 99 mg/dL   BUN 11  6 - 23 mg/dL   Creatinine, Ser 4.09  0.50 - 1.10 mg/dL   Calcium 9.3  8.4 - 81.1 mg/dL   Total Protein 7.1  6.0 - 8.3 g/dL   Albumin 3.8  3.5 - 5.2 g/dL   AST 26  0 - 37 U/L   ALT 48 (*) 0 - 35 U/L   Alkaline Phosphatase 46  39 - 117 U/L   Total Bilirubin 0.3  0.3 - 1.2 mg/dL   GFR calc non Af Amer >90  >90 mL/min   GFR calc Af Amer >90  >90 mL/min  LIPASE, BLOOD      Result Value Range   Lipase 60 (*) 11 - 59 U/L  URINALYSIS, ROUTINE W REFLEX MICROSCOPIC      Result Value Range   Color, Urine YELLOW  YELLOW   APPearance HAZY (*) CLEAR   Specific Gravity, Urine 1.020  1.005 - 1.030   pH 8.5 (*) 5.0 - 8.0   Glucose, UA NEGATIVE  NEGATIVE mg/dL   Hgb urine dipstick NEGATIVE  NEGATIVE   Bilirubin Urine NEGATIVE  NEGATIVE   Ketones, ur NEGATIVE  NEGATIVE mg/dL   Protein, ur NEGATIVE  NEGATIVE mg/dL   Urobilinogen, UA 0.2  0.0 - 1.0 mg/dL   Nitrite NEGATIVE  NEGATIVE    Leukocytes, UA NEGATIVE  NEGATIVE    Laboratory interpretation all normal except mild elevation of lipase   US Abdomen Limited Ruq  07/15/2012  *RADIOLOGY REPORT*  Ultrasound right upper quadrant  History:  Upper abdominal pain  Findings:  Gallbladder is visualized in multiple projections.  No gallstones are appreciated.  The gallbladder wall is mildly thickened with subtle edema.  There is no well-defined pericholecystic fluid, however.  There is no intrahepatic or extrahepatic biliary duct dilatation.  Liver shows diffuse increased echogenicity, consistent with fatty change. No focal liver lesions are identified.  Flow in the portal vein is in the anatomic direction.  Conclusion: No gallstones are seen.  However, there is thickening of the gallbladder wall with mild edema in this area.  Acalculous cholecystitis is of concern.  There is diffuse fatty liver.  While no focal liver lesions are identified, it must be cautioned that the sensitivity of ultrasound for more subtle liver lesions is diminished significantly given this degree of underlying fatty change.   Original Report Authenticated By: Bretta Bang, M.D.      1. Upper abdominal pain   2. GERD (gastroesophageal reflux disease)    New Prescriptions   OMEPRAZOLE (PRILOSEC OTC) 20 MG TABLET    Take 1 po BID x 2 weeks then once a day   PROMETHAZINE (PHENERGAN) 25 MG TABLET    Take 1 tablet (25 mg total) by mouth every 6 (six) hours as needed for nausea.   Plan discharge   Devoria Albe, MD, Armando Gang  MDM  I personally performed the services described in this documentation, which was scribed in my presence. The recorded information has been reviewed and considered.  Devoria Albe, MD, Armando Gang    Ward Givens, MD 07/15/12 [id number]

## 2013-03-03 ENCOUNTER — Emergency Department (HOSPITAL_COMMUNITY): Payer: Self-pay

## 2013-03-03 ENCOUNTER — Emergency Department (HOSPITAL_COMMUNITY)
Admission: EM | Admit: 2013-03-03 | Discharge: 2013-03-03 | Disposition: A | Discharge status: Home / Self Care | Payer: Self-pay | Attending: Emergency Medicine | Admitting: Emergency Medicine

## 2013-03-03 ENCOUNTER — Encounter (HOSPITAL_COMMUNITY): Payer: Self-pay | Admitting: Emergency Medicine

## 2013-03-03 DIAGNOSIS — Z79899 Other long term (current) drug therapy: Secondary | ICD-10-CM | POA: Insufficient documentation

## 2013-03-03 DIAGNOSIS — Z9089 Acquired absence of other organs: Secondary | ICD-10-CM | POA: Insufficient documentation

## 2013-03-03 DIAGNOSIS — Z3202 Encounter for pregnancy test, result negative: Secondary | ICD-10-CM | POA: Insufficient documentation

## 2013-03-03 DIAGNOSIS — D72829 Elevated white blood cell count, unspecified: Secondary | ICD-10-CM | POA: Insufficient documentation

## 2013-03-03 DIAGNOSIS — F17200 Nicotine dependence, unspecified, uncomplicated: Secondary | ICD-10-CM | POA: Insufficient documentation

## 2013-03-03 DIAGNOSIS — R141 Gas pain: Secondary | ICD-10-CM | POA: Insufficient documentation

## 2013-03-03 DIAGNOSIS — R51 Headache: Secondary | ICD-10-CM | POA: Insufficient documentation

## 2013-03-03 DIAGNOSIS — R142 Eructation: Secondary | ICD-10-CM | POA: Insufficient documentation

## 2013-03-03 DIAGNOSIS — M549 Dorsalgia, unspecified: Secondary | ICD-10-CM | POA: Insufficient documentation

## 2013-03-03 DIAGNOSIS — R42 Dizziness and giddiness: Secondary | ICD-10-CM | POA: Insufficient documentation

## 2013-03-03 DIAGNOSIS — I10 Essential (primary) hypertension: Secondary | ICD-10-CM | POA: Insufficient documentation

## 2013-03-03 DIAGNOSIS — R109 Unspecified abdominal pain: Secondary | ICD-10-CM | POA: Insufficient documentation

## 2013-03-03 DIAGNOSIS — R110 Nausea: Secondary | ICD-10-CM | POA: Insufficient documentation

## 2013-03-03 LAB — CBC WITH DIFFERENTIAL/PLATELET
Eosinophils Absolute: 0.4 10*3/uL (ref 0.0–0.7)
Eosinophils Relative: 4 % (ref 0–5)
HCT: 43.1 % (ref 36.0–46.0)
Hemoglobin: 14.7 g/dL (ref 12.0–15.0)
Lymphs Abs: 4.3 10*3/uL — ABNORMAL HIGH (ref 0.7–4.0)
MCH: 31.3 pg (ref 26.0–34.0)
MCV: 91.7 fL (ref 78.0–100.0)
Monocytes Relative: 7 % (ref 3–12)
Neutrophils Relative %: 51 % (ref 43–77)
RBC: 4.7 MIL/uL (ref 3.87–5.11)

## 2013-03-03 LAB — COMPREHENSIVE METABOLIC PANEL
ALT: 54 U/L — ABNORMAL HIGH (ref 0–35)
AST: 34 U/L (ref 0–37)
Albumin: 4.2 g/dL (ref 3.5–5.2)
Calcium: 9.4 mg/dL (ref 8.4–10.5)
Sodium: 138 mEq/L (ref 135–145)
Total Protein: 8 g/dL (ref 6.0–8.3)

## 2013-03-03 LAB — URINALYSIS, ROUTINE W REFLEX MICROSCOPIC
Hgb urine dipstick: NEGATIVE
Nitrite: NEGATIVE
Specific Gravity, Urine: >1.03 — ABNORMAL HIGH (ref 1.005–1.030)
Urobilinogen, UA: 0.2 mg/dL (ref 0.0–1.0)

## 2013-03-03 LAB — PREGNANCY, URINE: Preg Test, Ur: NEGATIVE

## 2013-03-03 MED ORDER — IOHEXOL 300 MG/ML  SOLN
50.0000 mL | Freq: Once | INTRAMUSCULAR | Status: AC | PRN
  Administered 2013-03-03: 50 mL via ORAL

## 2013-03-03 MED ORDER — SODIUM CHLORIDE 0.9 % IV SOLN: INTRAVENOUS | Status: DC

## 2013-03-03 MED ORDER — PROMETHAZINE HCL 25 MG PO TABS: 25.0000 mg | ORAL_TABLET | Freq: Four times a day (QID) | ORAL | Status: DC | PRN

## 2013-03-03 MED ORDER — ONDANSETRON HCL 4 MG/2ML IJ SOLN
4.0000 mg | Freq: Once | INTRAMUSCULAR | Status: AC
  Administered 2013-03-03: 4 mg via INTRAVENOUS
  Filled 2013-03-03: qty 2

## 2013-03-03 MED ORDER — IOHEXOL 300 MG/ML  SOLN
100.0000 mL | Freq: Once | INTRAMUSCULAR | Status: AC | PRN
  Administered 2013-03-03: 100 mL via INTRAVENOUS

## 2013-03-03 MED ORDER — SODIUM CHLORIDE 0.9 % IV BOLUS (SEPSIS)
1000.0000 mL | Freq: Once | INTRAVENOUS | Status: AC
  Administered 2013-03-03: 1000 mL via INTRAVENOUS

## 2013-03-03 MED ORDER — DOCUSATE SODIUM 100 MG PO CAPS: 100.0000 mg | ORAL_CAPSULE | Freq: Two times a day (BID) | ORAL | Status: DC

## 2013-03-03 NOTE — ED Notes (Signed)
Pt feels constipated. lnbm x 10 days ago. States has had small bm's since but is hard and hurts. Has used suppositories. Nad. Lower abd pain. Denies urinary sx's. Denies n/v

## 2013-03-03 NOTE — ED Notes (Signed)
Pt c/o constipation and generalized abdominal discomfort. Pt states her last normal BM was 7-10 days and since she has only small hard BMs. Pt reports blood on tissue paper when she wipes. Pt also reports nausea but denies vomiting.

## 2013-03-03 NOTE — Progress Notes (Signed)
ED/CM noted patient did not have health insurance and/or PCP listed in the computer.  Patient was given the [place] resource handout with information on the clinics, food pantries, and the handout for new health insurance sign-up.  Patient expressed appreciation for this. 

## 2013-03-03 NOTE — ED Provider Notes (Signed)
CSN: [id number]     Arrival date & time 03/03/13  1011 History   This chart was scribed for Shelda Jakes, MD, by Yevette Edwards, ED Scribe. This patient was seen in room APA08/APA08 and the patient's care was started at 11:54 PM.  First MD Initiated Contact with Patient 03/03/13 1129     Chief Complaint  Patient presents with  . Constipation    Patient is a 27 y.o. female presenting with constipation. The history is provided by the patient. No language interpreter was used.  Constipation Severity:  Moderate Time since last bowel movement: Since last normal BM. Timing:  Constant Progression:  Worsening Chronicity:  New Stool description:  Hard and small Relieved by:  Nothing Worsened by:  Nothing tried Ineffective treatments:  Defecation, laxatives and stool softeners Associated symptoms: abdominal pain, back pain and nausea   Associated symptoms: no dysuria, no fever and no vomiting   Risk factors: hx of abdominal surgery and obesity    HPI Comments: Dawn Ray is a 27 y.o. female who presents to the Emergency Department complaining of constipation which has been gradually worsening for ten days as is associated with lower abdominal pain which has been intermittent for three days. She reports her last normal BM was ten days ago, and her recent BM's are characterized as small, hard, and painful. She has used suppositories and Maxcitrate without relief. The pt has also had nausea, intermittent back pain,  flatulence, a headache, and one episode of lightheadedness. She denies any emesis, dysuria, rectal pain not associated with BMs, or SOB. She has a h/o appendectomy. The pt reports that she has irregular menstruations, and her last menstruation occurred a year or two ago. She is a daily smoker.   She uses [facility] for her PCP. Past Medical History  Diagnosis Date  . Hypertension    Past Surgical History  Procedure Laterality Date  . Appendectomy      History reviewed. No pertinent family history. History  Substance Use Topics  . Smoking status: Current Every Day Smoker -- 0.50 packs/day    Types: Cigarettes  . Smokeless tobacco: Not on file  . Alcohol Use: Yes     Comment: occasionally   No OB history provided.  Review of Systems  Constitutional: Negative for fever and chills.  HENT: Negative for congestion, rhinorrhea and sore throat.   Eyes: Negative for visual disturbance.  Respiratory: Negative for cough and shortness of breath.   Cardiovascular: Negative for leg swelling.  Gastrointestinal: Positive for nausea, abdominal pain and constipation. Negative for vomiting.  Genitourinary: Negative for dysuria.  Musculoskeletal: Positive for back pain. Negative for neck pain.  Skin: Negative for rash.  Neurological: Positive for light-headedness and headaches.  Hematological: Does not bruise/bleed easily.    Allergies  Review of patient's allergies indicates no known allergies.  Home Medications   Current Outpatient Rx  Name  Route  Sig  Dispense  Refill  . lisinopril (PRINIVIL,ZESTRIL) 20 MG tablet   Oral   Take 20 mg by mouth at bedtime.         . lovastatin (MEVACOR) 20 MG tablet   Oral   Take 20 mg by mouth at bedtime.         . naproxen sodium (ALEVE) 220 MG tablet   Oral   Take 220 mg by mouth daily as needed.         . ranitidine (ZANTAC) 150 MG tablet   Oral   Take  150 mg by mouth daily.         Marland Kitchen docusate sodium (COLACE) 100 MG capsule   Oral   Take 1 capsule (100 mg total) by mouth every 12 (twelve) hours.   10 capsule   1   . promethazine (PHENERGAN) 25 MG tablet   Oral   Take 1 tablet (25 mg total) by mouth every 6 (six) hours as needed for nausea.   12 tablet   1    Triage Vitals: BP 118/81  Pulse 91  Temp(Src) 98.3 F (36.8 C) (Oral)  Resp 20  Ht 5\' 3"  (1.6 m)  Wt 170 lb (77.111 kg)  BMI 30.12 kg/m2  SpO2 99%  LMP 03/03/2012  Physical Exam  Nursing note and vitals  reviewed. Constitutional: She is oriented to person, place, and time. She appears well-developed and well-nourished. No distress.  HENT:  Head: Normocephalic and atraumatic.  Eyes: EOM are normal.  Neck: Neck supple. No tracheal deviation present.  Cardiovascular: Normal rate, regular rhythm and normal heart sounds.   No murmur heard. Pulmonary/Chest: Effort normal and breath sounds normal. No respiratory distress. She has no wheezes. She has no rales.  Abdominal: There is no tenderness.  Decreased bowel sounds.   Genitourinary: Guaiac negative stool.  Rectal exam without evidence of external hemorrhoids are prolapsed internal hemorrhoids no fissure. Rectal vault without a significant amount of stool. Stool was heme-negative.  Musculoskeletal: Normal range of motion.  Neurological: She is alert and oriented to person, place, and time. No cranial nerve deficit.  Skin: Skin is warm and dry.  Psychiatric: She has a normal mood and affect. Her behavior is normal.    ED Course  Procedures (including critical care time)  DIAGNOSTIC STUDIES: Oxygen Saturation is 99% on room air, normal by my interpretation.    COORDINATION OF CARE:  12:02 AM- Discussed treatment plan with patient, and the patient agreed to the plan.   Labs Review Labs Reviewed  URINALYSIS, ROUTINE W REFLEX MICROSCOPIC - Abnormal; Notable for the following:    Specific Gravity, Urine >1.030 (*)    All other components within normal limits  COMPREHENSIVE METABOLIC PANEL - Abnormal; Notable for the following:    Glucose, Bld 100 (*)    ALT 54 (*)    All other components within normal limits  LIPASE, BLOOD - Abnormal; Notable for the following:    Lipase 67 (*)    All other components within normal limits  CBC WITH DIFFERENTIAL - Abnormal; Notable for the following:    WBC 11.5 (*)    Lymphs Abs 4.3 (*)    All other components within normal limits  PREGNANCY, URINE  OCCULT BLOOD, POC DEVICE   Results for orders  placed during the hospital encounter of 03/03/13  URINALYSIS, ROUTINE W REFLEX MICROSCOPIC      Result Value Range   Color, Urine YELLOW  YELLOW   APPearance CLEAR  CLEAR   Specific Gravity, Urine >1.030 (*) 1.005 - 1.030   pH 6.0  5.0 - 8.0   Glucose, UA NEGATIVE  NEGATIVE mg/dL   Hgb urine dipstick NEGATIVE  NEGATIVE   Bilirubin Urine NEGATIVE  NEGATIVE   Ketones, ur NEGATIVE  NEGATIVE mg/dL   Protein, ur NEGATIVE  NEGATIVE mg/dL   Urobilinogen, UA 0.2  0.0 - 1.0 mg/dL   Nitrite NEGATIVE  NEGATIVE   Leukocytes, UA NEGATIVE  NEGATIVE  PREGNANCY, URINE      Result Value Range   Preg Test, Ur NEGATIVE  NEGATIVE  COMPREHENSIVE METABOLIC PANEL      Result Value Range   Sodium 138  135 - 145 mEq/L   Potassium 4.0  3.5 - 5.1 mEq/L   Chloride 102  96 - 112 mEq/L   CO2 26  19 - 32 mEq/L   Glucose, Bld 100 (*) 70 - 99 mg/dL   BUN 13  6 - 23 mg/dL   Creatinine, Ser 1.61  0.50 - 1.10 mg/dL   Calcium 9.4  8.4 - 09.6 mg/dL   Total Protein 8.0  6.0 - 8.3 g/dL   Albumin 4.2  3.5 - 5.2 g/dL   AST 34  0 - 37 U/L   ALT 54 (*) 0 - 35 U/L   Alkaline Phosphatase 52  39 - 117 U/L   Total Bilirubin 0.4  0.3 - 1.2 mg/dL   GFR calc non Af Amer >90  >90 mL/min   GFR calc Af Amer >90  >90 mL/min  LIPASE, BLOOD      Result Value Range   Lipase 67 (*) 11 - 59 U/L  CBC WITH DIFFERENTIAL      Result Value Range   WBC 11.5 (*) 4.0 - 10.5 K/uL   RBC 4.70  3.87 - 5.11 MIL/uL   Hemoglobin 14.7  12.0 - 15.0 g/dL   HCT 04.5  40.9 - 81.1 %   MCV 91.7  78.0 - 100.0 fL   MCH 31.3  26.0 - 34.0 pg   MCHC 34.1  30.0 - 36.0 g/dL   RDW 91.4  78.2 - 95.6 %   Platelets 274  150 - 400 K/uL   Neutrophils Relative % 51  43 - 77 %   Neutro Abs 5.9  1.7 - 7.7 K/uL   Lymphocytes Relative 38  12 - 46 %   Lymphs Abs 4.3 (*) 0.7 - 4.0 K/uL   Monocytes Relative 7  3 - 12 %   Monocytes Absolute 0.8  0.1 - 1.0 K/uL   Eosinophils Relative 4  0 - 5 %   Eosinophils Absolute 0.4  0.0 - 0.7 K/uL   Basophils Relative 0  0  - 1 %   Basophils Absolute 0.0  0.0 - 0.1 K/uL  OCCULT BLOOD, POC DEVICE      Result Value Range   Fecal Occult Bld NEGATIVE  NEGATIVE    Imaging Review Ct Abdomen Pelvis W Contrast  03/03/2013   CLINICAL DATA:  Patient has unexplained constipation.  EXAM: CT ABDOMEN AND PELVIS WITH CONTRAST  TECHNIQUE: Multidetector CT imaging of the abdomen and pelvis was performed using the standard protocol following bolus administration of intravenous contrast. The patient also received oral contrast material.  CONTRAST:  50mL OMNIPAQUE IOHEXOL 300 MG/ML SOLN, OMNIPAQUE IOHEXOL 300 MG/ML SOLN  COMPARISON:  None.  FINDINGS: The liver exhibits mildly decreased density diffusely consistent with fatty infiltration. There is no focal mass or ductal dilation. The gallbladder is adequately distended with no evidence of calcified stones. The pancreas, adrenal glands, and kidneys exhibit no acute abnormalities. The spleen is normal in size, and there is a 1 cm diameter accessory spleen adjacent to its posterior tip. The caliber of the abdominal aorta is normal. The psoas musculature is normal in density and symmetric in contour.  The stomach is moderately distended with contrast. There is contrast within the normal-appearing small bowel loops. There is no evidence of enteritis or small bowel obstruction. Contrast has reached the colon. The appendix is not discretely identified. There is no pericecal inflammatory  change. The terminal ileum is normal in appearance. There is no evidence of colitis or diverticulitis.  Within the pelvis there are cystic appearing adnexal process ease bilaterally. On the right the cystic structure measures 3.7 cm in diameter. On the left the cystic process measures 3.3 cm in diameter. The uterus is normal in density and contour. The partially distended urinary bladder is normal in appearance. There is no inguinal or umbilical hernia. Subparagraph and lung window settings there is no  interstitial or alveolar infiltrate nor suspicious mass. There is no pleural effusion. The lumbar vertebral bodies are preserved in height. There is gentle S-shaped thoracolumbar scoliosis.  IMPRESSION: 1. There is no evidence of a small or large bowel obstruction. There is no evidence of colitis or diverticulitis nor enteritis.  2. There fatty infiltrative changes of the liver without evidence of focal masses. There is no ductal dilation. No gallstones are evident.  3. The pancreas exhibits no evidence of acute inflammation.  4. There is no acute urinary tract abnormality.  5. Hypodensities in the adnexal regions bilaterally likely reflect cystic ovarian processes. Followup pelvic ultrasound is recommended.   Electronically Signed   By: David  [place]   On: 03/03/2013 14:30      EKG Interpretation   None       MDM   1. Abdominal pain    Patient clinically felt as if she had constipation crampy abdominal pain no rectal pain and had a bowel movement in 10 days. CT scan shows no evidence of significant constipation. Rectal examination had no significant stool amount in the rectal vault. No evidence of fissure no evidence of prolapsed internal hemorrhoids or external hemorrhoids. CT scan without any sniffing Ermal is to include the pancreas and gallbladder patient does have a mild elevation in lipase could be some mild inflammation in the pancreas. Patient has no significant tenderness in the right lower corner. No evidence of appendicitis based on the CT scan. Patient complain of crampy abdominal pain said etiology not clear. Mild leukocytosis but otherwise nonspecific workup. Patient not pregnant no evidence urinary tract infection. Will treat with antinausea medicine and some Colace patient prefers not to be treated with pain medication as it may make it more difficult to have a bowel movement. Patient will return for new or worse symptoms. Patient will followup with [facility] in [facility] department.  I  personally performed the services described in this documentation, which was scribed in my presence. The recorded information has been reviewed and is accurate.     Shelda Jakes, MD 03/03/13 1524

## 2013-12-09 ENCOUNTER — Emergency Department (HOSPITAL_COMMUNITY)
Admission: EM | Admit: 2013-12-09 | Discharge: 2013-12-09 | Disposition: A | Discharge status: Home / Self Care | Payer: Self-pay | Attending: Emergency Medicine | Admitting: Emergency Medicine

## 2013-12-09 ENCOUNTER — Encounter (HOSPITAL_COMMUNITY): Payer: Self-pay | Admitting: Emergency Medicine

## 2013-12-09 DIAGNOSIS — Z79899 Other long term (current) drug therapy: Secondary | ICD-10-CM | POA: Insufficient documentation

## 2013-12-09 DIAGNOSIS — N898 Other specified noninflammatory disorders of vagina: Secondary | ICD-10-CM | POA: Insufficient documentation

## 2013-12-09 DIAGNOSIS — I10 Essential (primary) hypertension: Secondary | ICD-10-CM | POA: Insufficient documentation

## 2013-12-09 DIAGNOSIS — Z791 Long term (current) use of non-steroidal anti-inflammatories (NSAID): Secondary | ICD-10-CM | POA: Insufficient documentation

## 2013-12-09 DIAGNOSIS — Z9089 Acquired absence of other organs: Secondary | ICD-10-CM | POA: Insufficient documentation

## 2013-12-09 DIAGNOSIS — K625 Hemorrhage of anus and rectum: Secondary | ICD-10-CM

## 2013-12-09 DIAGNOSIS — F17200 Nicotine dependence, unspecified, uncomplicated: Secondary | ICD-10-CM | POA: Insufficient documentation

## 2013-12-09 NOTE — ED Notes (Signed)
Pt alert & oriented x4, stable gait. Patient given discharge instructions, paperwork & prescription(s). Patient  instructed to stop at the registration desk to finish any additional paperwork. Patient verbalized understanding. Pt left department w/ no further questions. 

## 2013-12-09 NOTE — ED Provider Notes (Signed)
CSN: [id number]     Arrival date & time 12/09/13  0111 History   First MD Initiated Contact with Patient 12/09/13 0124     Chief Complaint  Patient presents with  . Rectal Bleeding      HPI Patient reports 2 episodes today of a small amount of rectal bleeding.  Initially this morning it was associated with a normal bowel movement.  She denies constipation.  No use of anticoagulants.  This evening she states she sat on the commode to urinate and saw small amount of blood.  When she wiped her bottom a very small amount was on the tissue.  She has not had any perfuse bleeding.  She denies abdominal pain.  No lightheadedness.  No other complaints.  Symptoms are mild.   Past Medical History  Diagnosis Date  . Hypertension    Past Surgical History  Procedure Laterality Date  . Appendectomy     No family history on file. History  Substance Use Topics  . Smoking status: Current Every Day Smoker -- 0.50 packs/day    Types: Cigarettes  . Smokeless tobacco: Not on file  . Alcohol Use: Yes     Comment: occasionally   OB History   Grav Para Term Preterm Abortions TAB SAB Ect Mult Living                 Review of Systems  All other systems reviewed and are negative.     Allergies  Review of patient's allergies indicates no known allergies.  Home Medications   Prior to Admission medications   Medication Sig Start Date End Date Taking? Authorizing Provider  docusate sodium (COLACE) 100 MG capsule Take 1 capsule (100 mg total) by mouth every 12 (twelve) hours. 03/03/13  Yes Vanetta Mulders, MD  lisinopril (PRINIVIL,ZESTRIL) 20 MG tablet Take 20 mg by mouth at bedtime. 05/02/12  Yes Benny Lennert, MD  lovastatin (MEVACOR) 20 MG tablet Take 20 mg by mouth at bedtime.   Yes Historical Provider, MD  naproxen sodium (ALEVE) 220 MG tablet Take 220 mg by mouth daily as needed.   Yes Historical Provider, MD  promethazine (PHENERGAN) 25 MG tablet Take 1 tablet (25 mg total) by mouth every  6 (six) hours as needed for nausea. 03/03/13  Yes Vanetta Mulders, MD  ranitidine (ZANTAC) 150 MG tablet Take 150 mg by mouth daily.   Yes Historical Provider, MD   BP 110/76  Pulse 88  Temp(Src) 98 F (36.7 C) (Oral)  Resp 18  Ht 5\' 3"  (1.6 m)  Wt 180 lb (81.647 kg)  BMI 31.89 kg/m2  SpO2 100% Physical Exam  Nursing note and vitals reviewed. Constitutional: She is oriented to person, place, and time. She appears well-developed and well-nourished.  HENT:  Head: Normocephalic.  Eyes: EOM are normal.  Neck: Normal range of motion.  Pulmonary/Chest: Effort normal.  Abdominal: She exhibits no distension. There is no tenderness.  Genitourinary:  Chaperone present.  Small external nonbleeding hemorrhoid noted.  No fissures noted.  Rectal exam without significant tenderness.  No bright red blood encountered.  No palpable internal hemorrhoids.  No masses  Musculoskeletal: Normal range of motion.  Neurological: She is alert and oriented to person, place, and time.  Psychiatric: She has a normal mood and affect.    ED Course  Procedures (including critical care time) Labs Review Labs Reviewed - No data to display  Imaging Review No results found.   EKG Interpretation None  MDM   Final diagnoses:  Rectal bleeding    Likely small internal leading hemorrhoid.  No active bleeding at this time.  Vital signs normal.  Outpatient GI followup.  She understands to return to the ER for new or worsening symptoms    Lyanne CoKevin M Huldah Marin, MD 12/09/13 [phone]

## 2013-12-09 NOTE — ED Notes (Signed)
Pt reports she noticed blood in stool this morning. Has not improved. Tonight she noticed blood & did not have a bowel movement,.

## 2013-12-09 NOTE — Discharge Instructions (Signed)
Gastrointestinal Bleeding °Gastrointestinal bleeding is bleeding somewhere along the path that food travels through the body (digestive tract). This path is anywhere between the mouth and the opening of the butt (anus). You may have blood in your throw up (vomit) or in your poop (stools). If there is a lot of bleeding, you may need to stay in the hospital. °HOME CARE °· Only take medicine as told by your doctor. °· Eat foods with fiber such as whole grains, fruits, and vegetables. You can also try eating 1 to 3 prunes a day. °· Drink enough fluids to keep your pee (urine) clear or pale yellow. °GET HELP RIGHT AWAY IF:  °· Your bleeding gets worse. °· You feel dizzy, weak, or you pass out (faint). °· You have bad cramps in your back or belly (abdomen). °· You have large blood clumps (clots) in your poop. °· Your problems are getting worse. °MAKE SURE YOU:  °· Understand these instructions. °· Will watch your condition. °· Will get help right away if you are not doing well or get worse. °Document Released: 01/31/2008 Document Revised: 04/09/2012 Document Reviewed: 04/02/2011 °ExitCare® Patient Information ©2015 [employer]. This information is not intended to replace advice given to you by your health care provider. Make sure you discuss any questions you have with your health care provider. ° ° °

## 2015-06-29 ENCOUNTER — Emergency Department (HOSPITAL_COMMUNITY): Payer: BLUE CROSS/BLUE SHIELD

## 2015-06-29 ENCOUNTER — Emergency Department (HOSPITAL_COMMUNITY)
Admission: EM | Admit: 2015-06-29 | Discharge: 2015-06-29 | Disposition: A | Discharge status: Home / Self Care | Payer: BLUE CROSS/BLUE SHIELD | Attending: Emergency Medicine | Admitting: Emergency Medicine

## 2015-06-29 ENCOUNTER — Encounter (HOSPITAL_COMMUNITY): Payer: Self-pay | Admitting: Emergency Medicine

## 2015-06-29 DIAGNOSIS — I10 Essential (primary) hypertension: Secondary | ICD-10-CM | POA: Insufficient documentation

## 2015-06-29 DIAGNOSIS — R0789 Other chest pain: Secondary | ICD-10-CM | POA: Diagnosis not present

## 2015-06-29 DIAGNOSIS — B349 Viral infection, unspecified: Secondary | ICD-10-CM | POA: Diagnosis present

## 2015-06-29 DIAGNOSIS — F17210 Nicotine dependence, cigarettes, uncomplicated: Secondary | ICD-10-CM | POA: Insufficient documentation

## 2015-06-29 LAB — TROPONIN I: Troponin I: <0.03 ng/mL (ref <0.031)

## 2015-06-29 MED ORDER — PROMETHAZINE-CODEINE 6.25-10 MG/5ML PO SYRP
5.0000 mL | ORAL_SOLUTION | Freq: Once | ORAL | Status: DC
  Filled 2015-06-29: qty 5

## 2015-06-29 MED ORDER — BENZONATATE 100 MG PO CAPS
200.0000 mg | ORAL_CAPSULE | Freq: Once | ORAL | Status: AC
  Administered 2015-06-29: 200 mg via ORAL
  Filled 2015-06-29: qty 2

## 2015-06-29 MED ORDER — BENZONATATE 100 MG PO CAPS: 200.0000 mg | ORAL_CAPSULE | Freq: Three times a day (TID) | ORAL | Status: DC | PRN

## 2015-06-29 MED ORDER — PROMETHAZINE-CODEINE 6.25-10 MG/5ML PO SYRP: 5.0000 mL | ORAL_SOLUTION | ORAL | Status: DC | PRN

## 2015-06-29 NOTE — ED Provider Notes (Signed)
CSN: [id number]     Arrival date & time 06/29/15  1453 History   First MD Initiated Contact with Patient 06/29/15 1549     Chief Complaint  Patient presents with  . Cough     (Consider location/radiation/quality/duration/timing/severity/associated sxs/prior Treatment) The history is provided by the patient.   Dawn Ray is a 30 y.o. female with a history of HTN presenting with a 3 day history sore throat and nonproductive cough which she has been treating with coricidin HBP.  This am while resting on the couch she developed left sided chest pressure without pain which has been constant since 10 am today.  She has found no alleviators for her symptoms.  She endorses a history of acid reflux but states this pressure sensation is different as her reflux is epigastric and makes her nauseated.  She denies shortness of breath, wheezing,  nausea, vomiting, abdominal pain, back pain, diaphoresis, palpitations, headache, fevers or chills. She denies personal or family history of CAD.  Personal risk factors include HTN, obesity and smoking history.     Past Medical History  Diagnosis Date  . Hypertension    Past Surgical History  Procedure Laterality Date  . Appendectomy     No family history on file. Social History  Substance Use Topics  . Smoking status: Current Every Day Smoker -- 0.50 packs/day    Types: Cigarettes  . Smokeless tobacco: None  . Alcohol Use: Yes     Comment: occasionally   OB History    No data available     Review of Systems  Constitutional: Negative for fever and chills.  HENT: Positive for sore throat. Negative for congestion.   Eyes: Negative.   Respiratory: Positive for cough. Negative for chest tightness and shortness of breath.   Cardiovascular: Positive for chest pain. Negative for palpitations and leg swelling.  Gastrointestinal: Negative for nausea and abdominal pain.  Genitourinary: Negative.   Musculoskeletal: Negative for joint swelling,  arthralgias and neck pain.  Skin: Negative.  Negative for rash and wound.  Neurological: Negative for dizziness, weakness, light-headedness, numbness and headaches.  Psychiatric/Behavioral: Negative.       Allergies  Review of patient's allergies indicates no known allergies.  Home Medications   Prior to Admission medications   Medication Sig Start Date End Date Taking? Authorizing Provider  losartan (COZAAR) 25 MG tablet Take 25 mg by mouth daily.   Yes Historical Provider, MD  pantoprazole (PROTONIX) 40 MG tablet Take 40 mg by mouth daily.   Yes Historical Provider, MD  benzonatate (TESSALON) 100 MG capsule Take 2 capsules (200 mg total) by mouth 3 (three) times daily as needed. 06/29/15   Burgess Amor, PA-C  docusate sodium (COLACE) 100 MG capsule Take 1 capsule (100 mg total) by mouth every 12 (twelve) hours. Patient not taking: Reported on 06/29/2015 03/03/13   Vanetta Mulders, MD  promethazine (PHENERGAN) 25 MG tablet Take 1 tablet (25 mg total) by mouth every 6 (six) hours as needed for nausea. Patient not taking: Reported on 06/29/2015 03/03/13   Vanetta Mulders, MD  promethazine-codeine [facility] WITH CODEINE) 6.25-10 MG/5ML syrup Take 5 mLs by mouth every 4 (four) hours as needed for cough. 06/29/15   Burgess Amor, PA-C   BP 149/94 mmHg  Pulse 86  Temp(Src) 98.5 F (36.9 C) (Oral)  Resp 18  Ht  (1.6 m)  Wt 86.183 kg  BMI 33.67 kg/m2  SpO2 99% Physical Exam  Constitutional: She appears well-developed and well-nourished.  HENT:  Head: Normocephalic and atraumatic.  Mouth/Throat: Mucous membranes are normal. Posterior oropharyngeal erythema present. No oropharyngeal exudate.  Mild erythema, no tonsillar hypertrophy, no exudate.  Eyes: Conjunctivae are normal.  Neck: Normal range of motion. Neck supple.  Cardiovascular: Normal rate, regular rhythm, normal heart sounds and intact distal pulses.   Pulmonary/Chest: Effort normal and breath sounds normal. She has no wheezes.  She has no rales. She exhibits no tenderness.  Abdominal: Soft. Bowel sounds are normal. She exhibits no mass. There is no tenderness. There is no rebound and no guarding.  Musculoskeletal: Normal range of motion. She exhibits no edema or tenderness.  Neurological: She is alert.  Skin: Skin is warm and dry.  Psychiatric: She has a normal mood and affect.  Nursing note and vitals reviewed.   ED Course  Procedures (including critical care time) Labs Review Labs Reviewed  TROPONIN I    Imaging Review Dg Chest 2 View  06/29/2015  CLINICAL DATA:  Sore throat and cough for 3 days. Chest pain today. Initial encounter. EXAM: CHEST  2 VIEW COMPARISON:  PA and lateral chest 04/12/2011. FINDINGS: The lungs are clear. Heart size is normal. No pneumothorax or pleural effusion. Marked S shaped thoracolumbar scoliosis is unchanged. IMPRESSION: No acute disease. Electronically Signed   By: Drusilla Kanner M.D.   On: 06/29/2015 15:32   I have personally reviewed and evaluated these images and lab results as part of my medical decision-making.   EKG Interpretation   Date/Time:  Wednesday June 29 2015 15:07:50 EST Ventricular Rate:  86 PR Interval:  122 QRS Duration: 72 QT Interval:  358 QTC Calculation: 428 R Axis:   -9 Text Interpretation:  Normal sinus rhythm Moderate voltage criteria for  LVH, may be normal variant Borderline ECG Confirmed by [facility] MD, Barbara Cower  [phone]) on 06/29/2015 4:34:20 PM      MDM   Final diagnoses:  Viral syndrome    Pt with symptoms suggesting viral syndrome, but with a 9 hour history of left sided chest pressure, will obtain a troponin.  Pt  EKG per above. CXR clear. Will plan tessalon, phenergan with codeine if troponin negative.  No sob, no pleuritic sx, normal VS, doubt PE.  Pt advised f/u with pcp if sx persist, returning here for any worsened sx.  Advised smoking cessation.    Burgess Amor, PA-C 06/29/15 1730  Burgess Amor, PA-C 06/29/15 1731  Marily Memos, MD 06/30/15 [phone]

## 2015-06-29 NOTE — ED Notes (Signed)
Pt c/o sore throat, cough x 3 days with cp today.

## 2015-06-29 NOTE — Discharge Instructions (Signed)
Viral Infections °A viral infection can be caused by different types of viruses. Most viral infections are not serious and resolve on their own. However, some infections may cause severe symptoms and may lead to further complications. °SYMPTOMS °Viruses can frequently cause: °· Minor sore throat. °· Aches and pains. °· Headaches. °· Runny nose. °· Different types of rashes. °· Watery eyes. °· Tiredness. °· Cough. °· Loss of appetite. °· Gastrointestinal infections, resulting in nausea, vomiting, and diarrhea. °These symptoms do not respond to antibiotics because the infection is not caused by bacteria. However, you might catch a bacterial infection following the viral infection. This is sometimes called a "superinfection." Symptoms of such a bacterial infection may include: °· Worsening sore throat with pus and difficulty swallowing. °· Swollen neck glands. °· Chills and a high or persistent fever. °· Severe headache. °· Tenderness over the sinuses. °· Persistent overall ill feeling (malaise), muscle aches, and tiredness (fatigue). °· Persistent cough. °· Yellow, green, or brown mucus production with coughing. °HOME CARE INSTRUCTIONS  °· Only take over-the-counter or prescription medicines for pain, discomfort, diarrhea, or fever as directed by your caregiver. °· Drink enough water and fluids to keep your urine clear or pale yellow. Sports drinks can provide valuable electrolytes, sugars, and hydration. °· Get plenty of rest and maintain proper nutrition. Soups and broths with crackers or rice are fine. °SEEK IMMEDIATE MEDICAL CARE IF:  °· You have severe headaches, shortness of breath, chest pain, neck pain, or an unusual rash. °· You have uncontrolled vomiting, diarrhea, or you are unable to keep down fluids. °· You or your child has an oral temperature above 102° F (38.9° C), not controlled by medicine. °· Your baby is older than 3 months with a rectal temperature of 102° F (38.9° C) or higher. °· Your baby is 3  months old or younger with a rectal temperature of 100.4° F (38° C) or higher. °MAKE SURE YOU:  °· Understand these instructions. °· Will watch your condition. °· Will get help right away if you are not doing well or get worse. °  °This information is not intended to replace advice given to you by your health care provider. Make sure you discuss any questions you have with your health care provider. °  °Document Released: 01/31/2005 Document Revised: 07/16/2011 Document Reviewed: 09/29/2014 °Elsevier Interactive Patient Education ©2016 Elsevier Inc. ° °

## 2015-08-15 ENCOUNTER — Encounter: Payer: Self-pay | Admitting: Gastroenterology

## 2015-09-08 ENCOUNTER — Ambulatory Visit (INDEPENDENT_AMBULATORY_CARE_PROVIDER_SITE_OTHER): Payer: BLUE CROSS/BLUE SHIELD | Admitting: Gastroenterology

## 2015-09-08 ENCOUNTER — Encounter: Payer: Self-pay | Admitting: Gastroenterology

## 2015-09-08 VITALS — BP 149/98 | HR 85 | Temp 98.2°F | Ht 63.0 in | Wt 202.6 lb

## 2015-09-08 DIAGNOSIS — K5900 Constipation, unspecified: Secondary | ICD-10-CM | POA: Diagnosis not present

## 2015-09-08 NOTE — Assessment & Plan Note (Signed)
30 year old female with constipation, no alarm features, needing more aggressive bowel regimen. Associated upper abdominal discomfort and nausea likely related to significant constipation. Will trial Linzess 72 mcg once daily and return in 6-8 weeks. Samples and copay card provided. Patient to call with progress update.

## 2015-09-08 NOTE — Progress Notes (Signed)
cc'ed to pcp °

## 2015-09-08 NOTE — Patient Instructions (Signed)
I have given you samples of Linzess 72 mcg to take once each morning on an empty stomach, 30 minutes before breakfast. You may have diarrhea for the first few days, but it should get better. Let me know if it doesn't.  I will see you back in 6-8 weeks!

## 2015-09-08 NOTE — Progress Notes (Signed)
    Primary Care Physician:  Dwana MelenaZack Hall, MD Primary Gastroenterologist:  Dr. Darrick PennaFields   Chief Complaint  Patient presents with  . Abdominal Pain    HPI:   Dawn Ray is a 30 y.o. female presenting today at the request of Dr. Margo AyeHall secondary to abdominal pain, nausea, and chronic constipation. Will have no BM for a few days then when finally goes, has diarrhea. Always hurting upper abdomen. Associated nausea with constipation. Protonix helps with reflux. Has woken up at night with a "throw-up" taste in mouth. Has tried OTC agents in past. No rectal bleeding. Still feels bloated after bowel movements.   Past Medical History  Diagnosis Date  . Hypertension   . Chronic constipation   . GERD (gastroesophageal reflux disease)     Past Surgical History  Procedure Laterality Date  . Appendectomy      Current Outpatient Prescriptions  Medication Sig Dispense Refill  . losartan (COZAAR) 25 MG tablet Take 25 mg by mouth daily.    . pantoprazole (PROTONIX) 40 MG tablet Take 40 mg by mouth daily.     No current facility-administered medications for this visit.    Allergies as of 09/08/2015  . (No Known Allergies)    Family History  Problem Relation Age of Onset  . Colon cancer Neg Hx     Social History   Social History  . Marital Status: Single    Spouse Name: N/A  . Number of Children: N/A  . Years of Education: N/A   Occupational History  . employed    Social History Main Topics  . Smoking status: Current Every Day Smoker -- 0.50 packs/day    Types: Cigarettes  . Smokeless tobacco: Not on file     Comment: half pack daily  . Alcohol Use: 0.0 oz/week    0 Standard drinks or equivalent per week     Comment: occasionally  . Drug Use: No  . Sexual Activity: Yes    Birth Control/ Protection: None   Other Topics Concern  . Not on file   Social History Narrative    Review of Systems: Negative unless mentioned in HPI.   Physical Exam: BP 149/98 mmHg  Pulse  85  Temp(Src) 98.2 F (36.8 C) (Oral)  Ht 5\' 3"  (1.6 m)  Wt 202 lb 9.6 oz (91.899 kg)  BMI 35.90 kg/m2 General:   Alert and oriented. Pleasant and cooperative. Well-nourished and well-developed.  Head:  Normocephalic and atraumatic. Eyes:  Without icterus, sclera clear and conjunctiva pink.  Ears:  Normal auditory acuity. Nose:  No deformity, discharge,  or lesions. Mouth:  No deformity or lesions, oral mucosa pink.  Lungs:  Clear to auscultation bilaterally. No wheezes, rales, or rhonchi. No distress.  Heart:  S1, S2 present without murmurs appreciated.  Abdomen:  +BS, soft, non-tender and non-distended. No HSM noted. No guarding or rebound. No masses appreciated.  Rectal:  Deferred  Msk:  Symmetrical without gross deformities. Normal posture. Extremities:  Without edema. Neurologic:  Alert and  oriented x4;  grossly normal neurologically. Psych:  Alert and cooperative. Normal mood and affect.

## 2015-09-15 ENCOUNTER — Telehealth: Payer: Self-pay

## 2015-09-15 NOTE — Telephone Encounter (Signed)
Pt mother called and states that the patient is wanting an RX for the Linzess

## 2015-09-16 ENCOUNTER — Telehealth: Payer: Self-pay | Admitting: Gastroenterology

## 2015-09-16 MED ORDER — LINACLOTIDE 72 MCG PO CAPS: 72.0000 ug | ORAL_CAPSULE | Freq: Every day | ORAL | Status: DC

## 2015-09-16 NOTE — Telephone Encounter (Signed)
Pt's mother called to say she had called yesterday about Erick's Linzess prescription and said it still isn't at the pharmacy and to please call her back. [phone]

## 2015-09-16 NOTE — Telephone Encounter (Signed)
Routing to the refill box. 

## 2015-09-16 NOTE — Telephone Encounter (Signed)
RX for Linzess daily sent to pharmacy.

## 2015-09-19 NOTE — Telephone Encounter (Signed)
Completed on 5/12 by Verlon AuLeslie.

## 2015-09-19 NOTE — Telephone Encounter (Signed)
LMOM that Rx has been sent in.  

## 2015-10-08 ENCOUNTER — Emergency Department (HOSPITAL_COMMUNITY): Payer: BLUE CROSS/BLUE SHIELD

## 2015-10-08 ENCOUNTER — Emergency Department (HOSPITAL_COMMUNITY)
Admission: EM | Admit: 2015-10-08 | Discharge: 2015-10-09 | Disposition: A | Discharge status: Home / Self Care | Payer: BLUE CROSS/BLUE SHIELD | Attending: Emergency Medicine | Admitting: Emergency Medicine

## 2015-10-08 ENCOUNTER — Encounter (HOSPITAL_COMMUNITY): Payer: Self-pay

## 2015-10-08 DIAGNOSIS — F17210 Nicotine dependence, cigarettes, uncomplicated: Secondary | ICD-10-CM | POA: Diagnosis not present

## 2015-10-08 DIAGNOSIS — R1011 Right upper quadrant pain: Secondary | ICD-10-CM | POA: Insufficient documentation

## 2015-10-08 DIAGNOSIS — I10 Essential (primary) hypertension: Secondary | ICD-10-CM | POA: Diagnosis not present

## 2015-10-08 DIAGNOSIS — R110 Nausea: Secondary | ICD-10-CM | POA: Diagnosis not present

## 2015-10-08 DIAGNOSIS — Z79899 Other long term (current) drug therapy: Secondary | ICD-10-CM | POA: Insufficient documentation

## 2015-10-08 LAB — CBC WITH DIFFERENTIAL/PLATELET
BASOS ABS: 0 10*3/uL (ref 0.0–0.1)
BASOS PCT: 0 %
EOS ABS: 0.4 10*3/uL (ref 0.0–0.7)
Eosinophils Relative: 3 %
HEMATOCRIT: 41.8 % (ref 36.0–46.0)
Hemoglobin: 13.5 g/dL (ref 12.0–15.0)
Lymphocytes Relative: 30 %
Lymphs Abs: 3.4 10*3/uL (ref 0.7–4.0)
MCH: 29.4 pg (ref 26.0–34.0)
MCHC: 32.3 g/dL (ref 30.0–36.0)
MCV: 91.1 fL (ref 78.0–100.0)
MONO ABS: 0.8 10*3/uL (ref 0.1–1.0)
Monocytes Relative: 7 %
NEUTROS ABS: 6.9 10*3/uL (ref 1.7–7.7)
NEUTROS PCT: 60 %
Platelets: 279 10*3/uL (ref 150–400)
RBC: 4.59 MIL/uL (ref 3.87–5.11)
RDW: 12.6 % (ref 11.5–15.5)
WBC: 11.5 10*3/uL — ABNORMAL HIGH (ref 4.0–10.5)

## 2015-10-08 LAB — URINALYSIS, ROUTINE W REFLEX MICROSCOPIC
Bilirubin Urine: NEGATIVE
Glucose, UA: NEGATIVE mg/dL
HGB URINE DIPSTICK: NEGATIVE
Ketones, ur: NEGATIVE mg/dL
NITRITE: NEGATIVE
PROTEIN: NEGATIVE mg/dL
SPECIFIC GRAVITY, URINE: 1.025 (ref 1.005–1.030)
pH: 6 (ref 5.0–8.0)

## 2015-10-08 LAB — URINE MICROSCOPIC-ADD ON

## 2015-10-08 LAB — POC URINE PREG, ED: PREG TEST UR: NEGATIVE

## 2015-10-08 MED ORDER — DIATRIZOATE MEGLUMINE & SODIUM 66-10 % PO SOLN
ORAL | Status: AC
  Filled 2015-10-08: qty 30

## 2015-10-08 MED ORDER — SODIUM CHLORIDE 0.9 % IV BOLUS (SEPSIS)
1000.0000 mL | Freq: Once | INTRAVENOUS | Status: AC
  Administered 2015-10-08: 1000 mL via INTRAVENOUS

## 2015-10-08 MED ORDER — ONDANSETRON HCL 4 MG/2ML IJ SOLN
4.0000 mg | Freq: Once | INTRAMUSCULAR | Status: AC
  Administered 2015-10-08: 4 mg via INTRAVENOUS
  Filled 2015-10-08: qty 2

## 2015-10-08 MED ORDER — IOPAMIDOL (ISOVUE-300) INJECTION 61%
100.0000 mL | Freq: Once | INTRAVENOUS | Status: AC | PRN
  Administered 2015-10-09: 100 mL via INTRAVENOUS

## 2015-10-08 NOTE — ED Notes (Signed)
Started having abdominal pain in February and they put me on medication for constipation, put me on Linzess.  Started having diarrhea really bad since Thursday. Having severe abdominal pain, bloating, nauseated, no vomiting.

## 2015-10-08 NOTE — ED Provider Notes (Signed)
CSN: [id number]     Arrival date & time 10/08/15  2200 History   First MD Initiated Contact with Patient 10/08/15 2229     Chief Complaint  Patient presents with  . Abdominal Pain     (Consider location/radiation/quality/duration/timing/severity/associated sxs/prior Treatment) The history is provided by the patient and medical records. No language interpreter was used.     Carver FilaStephanie D Gow is a 30 y.o. female  with a hx of HTN, chronic constipation, GERD presents to the Emergency Department  complaining of gradual, persistent, progressively worsening RUQ abdominal pain onset 2 weeks ago but significantly worsening 3 days ago.  Associated symptoms include watery diarrhea, bloating, nausea for the last 3 days.  Pt reports she was seen by a gastroenterologist in May for the same pain and was placed on Linzess for constipation. Pt reports the pain mostly comes at night.  She denies fever, chills, headache, chest pain, SOB, vomiting, weakness, dizziness, syncope.  Pt denies recent imaging of the abd.  She reports appendectomy, but no other abd surgeries.    Past Medical History  Diagnosis Date  . Hypertension   . Chronic constipation   . GERD (gastroesophageal reflux disease)    Past Surgical History  Procedure Laterality Date  . Appendectomy     Family History  Problem Relation Age of Onset  . Colon cancer Neg Hx    Social History  Substance Use Topics  . Smoking status: Current Every Day Smoker -- 0.50 packs/day    Types: Cigarettes  . Smokeless tobacco: None     Comment: half pack daily  . Alcohol Use: 0.0 oz/week    0 Standard drinks or equivalent per week     Comment: occasionally   OB History    No data available     Review of Systems  Constitutional: Negative for fever, diaphoresis, appetite change, fatigue and unexpected weight change.  HENT: Negative for mouth sores and trouble swallowing.   Respiratory: Negative for cough, chest tightness, shortness of breath,  wheezing and stridor.   Cardiovascular: Negative for chest pain and palpitations.  Gastrointestinal: Positive for nausea, abdominal pain and abdominal distention (bloating). Negative for vomiting, diarrhea, constipation, blood in stool and rectal pain.  Genitourinary: Negative for dysuria, urgency, frequency, hematuria, flank pain and difficulty urinating.  Musculoskeletal: Negative for back pain, neck pain and neck stiffness.  Skin: Negative for rash.  Neurological: Negative for weakness.  Hematological: Negative for adenopathy.  Psychiatric/Behavioral: Negative for confusion.  All other systems reviewed and are negative.     Allergies  Review of patient's allergies indicates no known allergies.  Home Medications   Prior to Admission medications   Medication Sig Start Date End Date Taking? Authorizing Provider  linaclotide Karlene Einstein(LINZESS) 72 MCG capsule Take 1 capsule (72 mcg total) by mouth daily before breakfast. 09/16/15  Yes Tiffany KocherLeslie S Lewis, PA-C  losartan (COZAAR) 25 MG tablet Take 25 mg by mouth daily.   Yes Historical Provider, MD  pantoprazole (PROTONIX) 40 MG tablet Take 40 mg by mouth daily.   Yes Historical Provider, MD   BP 149/86 mmHg  Pulse 71  Temp(Src) 98.2 F (36.8 C) (Oral)  Resp 18  Ht 5\' 3"  (1.6 m)  Wt 86.183 kg  BMI 33.67 kg/m2  SpO2 100%  LMP  Physical Exam  Constitutional: She appears well-developed and well-nourished. No distress.  Awake, alert, nontoxic appearance  HENT:  Head: Normocephalic and atraumatic.  Mouth/Throat: Oropharynx is clear and moist. No oropharyngeal exudate.  Eyes: Conjunctivae  are normal. No scleral icterus.  Neck: Normal range of motion. Neck supple.  Cardiovascular: Normal rate, regular rhythm, normal heart sounds and intact distal pulses.   Pulmonary/Chest: Effort normal and breath sounds normal. No respiratory distress. She has no wheezes.  Equal chest expansion  Abdominal: Soft. Bowel sounds are normal. She exhibits no  distension and no mass. There is tenderness in the right upper quadrant and epigastric area. There is guarding and positive Murphy's sign. There is no rebound and no CVA tenderness.  Musculoskeletal: Normal range of motion. She exhibits no edema.  Neurological: She is alert.  Speech is clear and goal oriented Moves extremities without ataxia  Skin: Skin is warm and dry. She is not diaphoretic.  Psychiatric: She has a normal mood and affect.  Nursing note and vitals reviewed.   ED Course  Procedures (including critical care time) Labs Review Labs Reviewed  URINALYSIS, ROUTINE W REFLEX MICROSCOPIC (NOT AT [facility]) - Abnormal; Notable for the following:    Leukocytes, UA SMALL (*)    All other components within normal limits  URINE MICROSCOPIC-ADD ON - Abnormal; Notable for the following:    Squamous Epithelial / LPF 6-30 (*)    Bacteria, UA FEW (*)    All other components within normal limits  CBC WITH DIFFERENTIAL/PLATELET - Abnormal; Notable for the following:    WBC 11.5 (*)    All other components within normal limits  COMPREHENSIVE METABOLIC PANEL - Abnormal; Notable for the following:    Glucose, Bld 137 (*)    All other components within normal limits  LIPASE, BLOOD - Abnormal; Notable for the following:    Lipase 53 (*)    All other components within normal limits  POC URINE PREG, ED    MDM   Final diagnoses:  RUQ abdominal pain  Nausea   SHUNTIA EXTON presents with RUQ abd pain and nausea worse for several days.  Pt at risk for gallstones.  Afebrile without emesis.  Guarding with RUQ palpation.  Mild leukocytosis.  Unable to obtain US tonight.  Mild elevation in lipase.    1:17 AM At shift change care transferred to Dr. Manus Gunning.  CT scan pending.  He will reassess patient, follow-up on CT and determine disposition.  If CT normal and pt d/c home would plan for outpatient [place] as soon as possible.  VSS.    Dahlia Client Lucindy Borel, PA-C 10/09/15 1610  Glynn Octave,  MD 10/09/15 [phone]

## 2015-10-09 ENCOUNTER — Other Ambulatory Visit (HOSPITAL_COMMUNITY): Payer: Self-pay | Admitting: Emergency Medicine

## 2015-10-09 ENCOUNTER — Inpatient Hospital Stay (HOSPITAL_COMMUNITY): Admit: 2015-10-09 | Payer: BLUE CROSS/BLUE SHIELD

## 2015-10-09 ENCOUNTER — Ambulatory Visit (HOSPITAL_COMMUNITY)
Admit: 2015-10-09 | Discharge: 2015-10-09 | Disposition: A | Discharge status: Home / Self Care | Payer: BLUE CROSS/BLUE SHIELD | Attending: Emergency Medicine | Admitting: Emergency Medicine

## 2015-10-09 ENCOUNTER — Ambulatory Visit (HOSPITAL_COMMUNITY)
Admission: RE | Admit: 2015-10-09 | Discharge: 2015-10-09 | Disposition: A | Discharge status: Home / Self Care | Payer: BLUE CROSS/BLUE SHIELD | Source: Ambulatory Visit | Attending: Emergency Medicine | Admitting: Emergency Medicine

## 2015-10-09 DIAGNOSIS — N83209 Unspecified ovarian cyst, unspecified side: Secondary | ICD-10-CM | POA: Insufficient documentation

## 2015-10-09 DIAGNOSIS — N83292 Other ovarian cyst, left side: Secondary | ICD-10-CM

## 2015-10-09 DIAGNOSIS — N838 Other noninflammatory disorders of ovary, fallopian tube and broad ligament: Secondary | ICD-10-CM | POA: Insufficient documentation

## 2015-10-09 LAB — COMPREHENSIVE METABOLIC PANEL
ALBUMIN: 4.2 g/dL (ref 3.5–5.0)
ALK PHOS: 44 U/L (ref 38–126)
ALT: 43 U/L (ref 14–54)
AST: 29 U/L (ref 15–41)
Anion gap: 9 (ref 5–15)
BUN: 13 mg/dL (ref 6–20)
CALCIUM: 9 mg/dL (ref 8.9–10.3)
CO2: 25 mmol/L (ref 22–32)
Chloride: 104 mmol/L (ref 101–111)
Creatinine, Ser: 0.77 mg/dL (ref 0.44–1.00)
GFR calc Af Amer: >60 mL/min (ref >60)
GFR calc non Af Amer: >60 mL/min (ref >60)
GLUCOSE: 137 mg/dL — AB (ref 65–99)
Potassium: 3.8 mmol/L (ref 3.5–5.1)
SODIUM: 138 mmol/L (ref 135–145)
TOTAL PROTEIN: 7.7 g/dL (ref 6.5–8.1)
Total Bilirubin: 0.3 mg/dL (ref 0.3–1.2)

## 2015-10-09 LAB — LIPASE, BLOOD: Lipase: 53 U/L — ABNORMAL HIGH (ref 11–51)

## 2015-10-09 MED ORDER — OMEPRAZOLE 20 MG PO CPDR: 20.0000 mg | DELAYED_RELEASE_CAPSULE | Freq: Every day | ORAL | Status: DC

## 2015-10-09 NOTE — Discharge Instructions (Signed)
Abdominal Pain, Adult Follow up for the ultrasound of your gallbladder. You have a large ovarian cyst. Return to the ED if you develop worsening pain, fever, vomiting, or any other concerns. Many things can cause abdominal pain. Usually, abdominal pain is not caused by a disease and will improve without treatment. It can often be observed and treated at home. Your health care provider will do a physical exam and possibly order blood tests and X-rays to help determine the seriousness of your pain. However, in many cases, more time must pass before a clear cause of the pain can be found. Before that point, your health care provider may not know if you need more testing or further treatment. HOME CARE INSTRUCTIONS Monitor your abdominal pain for any changes. The following actions may help to alleviate any discomfort you are experiencing:  Only take over-the-counter or prescription medicines as directed by your health care provider.  Do not take laxatives unless directed to do so by your health care provider.  Try a clear liquid diet (broth, tea, or water) as directed by your health care provider. Slowly move to a bland diet as tolerated. SEEK MEDICAL CARE IF:  You have unexplained abdominal pain.  You have abdominal pain associated with nausea or diarrhea.  You have pain when you urinate or have a bowel movement.  You experience abdominal pain that wakes you in the night.  You have abdominal pain that is worsened or improved by eating food.  You have abdominal pain that is worsened with eating fatty foods.  You have a fever. SEEK IMMEDIATE MEDICAL CARE IF:  Your pain does not go away within 2 hours.  You keep throwing up (vomiting).  Your pain is felt only in portions of the abdomen, such as the right side or the left lower portion of the abdomen.  You pass bloody or black tarry stools. MAKE SURE YOU:  Understand these instructions.  Will watch your condition.  Will get help  right away if you are not doing well or get worse.   This information is not intended to replace advice given to you by your health care provider. Make sure you discuss any questions you have with your health care provider.   Document Released: 01/31/2005 Document Revised: 01/12/2015 Document Reviewed: 12/31/2012 Elsevier Interactive Patient Education [employer]2016 Elsevier Inc.

## 2015-10-09 NOTE — ED Provider Notes (Signed)
Reviewed US reports with pt - no acute fidnings to suggest need for emergent evaluation or surgery Given copy of her reports Stable for d/c Expressed understanding.  Eber HongBrian Jasnoor Trussell, MD 10/09/15 1314

## 2015-10-09 NOTE — ED Provider Notes (Signed)
CT scan pending to assess for RUQ pain with nausea.   CT with no acute findings. There is a large right ovarian cyst. Gallbladder appears normal.  Will schedule outpatient RUQ US as well as pelvic US to further evaluate ovarian cyst.  She has no lower abdominal pain at this time. Low suspicion for ovarian torsion.  Start PPI.  Follow up with PCP. Return precautions discussed.  BP 149/86 mmHg  Pulse 71  Temp(Src) 98.2 F (36.8 C) (Oral)  Resp 18  Ht 5\' 3"  (1.6 m)  Wt 190 lb (86.183 kg)  BMI 33.67 kg/m2  SpO2 100%  LMP    Glynn OctaveStephen Teshaun Olarte, MD 10/09/15 [phone]

## 2015-10-12 ENCOUNTER — Telehealth: Payer: Self-pay | Admitting: Gastroenterology

## 2015-10-12 ENCOUNTER — Emergency Department (HOSPITAL_COMMUNITY)
Admission: EM | Admit: 2015-10-12 | Discharge: 2015-10-12 | Disposition: A | Discharge status: Home / Self Care | Payer: BLUE CROSS/BLUE SHIELD | Attending: Emergency Medicine | Admitting: Emergency Medicine

## 2015-10-12 ENCOUNTER — Encounter (HOSPITAL_COMMUNITY): Payer: Self-pay | Admitting: Emergency Medicine

## 2015-10-12 ENCOUNTER — Emergency Department (HOSPITAL_COMMUNITY): Payer: BLUE CROSS/BLUE SHIELD

## 2015-10-12 DIAGNOSIS — Z7982 Long term (current) use of aspirin: Secondary | ICD-10-CM | POA: Diagnosis not present

## 2015-10-12 DIAGNOSIS — R1011 Right upper quadrant pain: Secondary | ICD-10-CM

## 2015-10-12 DIAGNOSIS — I10 Essential (primary) hypertension: Secondary | ICD-10-CM | POA: Insufficient documentation

## 2015-10-12 DIAGNOSIS — F17210 Nicotine dependence, cigarettes, uncomplicated: Secondary | ICD-10-CM | POA: Diagnosis not present

## 2015-10-12 DIAGNOSIS — R197 Diarrhea, unspecified: Secondary | ICD-10-CM | POA: Diagnosis not present

## 2015-10-12 DIAGNOSIS — Z79899 Other long term (current) drug therapy: Secondary | ICD-10-CM | POA: Diagnosis not present

## 2015-10-12 LAB — COMPREHENSIVE METABOLIC PANEL
ALT: 37 U/L (ref 14–54)
ANION GAP: 5 (ref 5–15)
AST: 23 U/L (ref 15–41)
Albumin: 4.4 g/dL (ref 3.5–5.0)
Alkaline Phosphatase: 44 U/L (ref 38–126)
BUN: 10 mg/dL (ref 6–20)
CHLORIDE: 103 mmol/L (ref 101–111)
CO2: 29 mmol/L (ref 22–32)
CREATININE: 0.74 mg/dL (ref 0.44–1.00)
Calcium: 8.9 mg/dL (ref 8.9–10.3)
Glucose, Bld: 103 mg/dL — ABNORMAL HIGH (ref 65–99)
POTASSIUM: 4 mmol/L (ref 3.5–5.1)
Sodium: 137 mmol/L (ref 135–145)
Total Bilirubin: 0.5 mg/dL (ref 0.3–1.2)
Total Protein: 7.9 g/dL (ref 6.5–8.1)

## 2015-10-12 LAB — CBC WITH DIFFERENTIAL/PLATELET
Basophils Absolute: 0.1 10*3/uL (ref 0.0–0.1)
Basophils Relative: 1 %
EOS ABS: 0.3 10*3/uL (ref 0.0–0.7)
Eosinophils Relative: 3 %
HCT: 43.5 % (ref 36.0–46.0)
HEMOGLOBIN: 14.5 g/dL (ref 12.0–15.0)
LYMPHS ABS: 2.5 10*3/uL (ref 0.7–4.0)
LYMPHS PCT: 26 %
MCH: 30.5 pg (ref 26.0–34.0)
MCHC: 33.3 g/dL (ref 30.0–36.0)
MCV: 91.6 fL (ref 78.0–100.0)
Monocytes Absolute: 0.5 10*3/uL (ref 0.1–1.0)
Monocytes Relative: 5 %
NEUTROS ABS: 6.3 10*3/uL (ref 1.7–7.7)
NEUTROS PCT: 65 %
Platelets: 285 10*3/uL (ref 150–400)
RBC: 4.75 MIL/uL (ref 3.87–5.11)
RDW: 12.5 % (ref 11.5–15.5)
WBC: 9.6 10*3/uL (ref 4.0–10.5)

## 2015-10-12 LAB — LIPASE, BLOOD: LIPASE: 40 U/L (ref 11–51)

## 2015-10-12 MED ORDER — SINCALIDE 5 MCG IJ SOLR
INTRAMUSCULAR | Status: AC
  Administered 2015-10-12: 1.73 ug
  Filled 2015-10-12: qty 5

## 2015-10-12 MED ORDER — DICYCLOMINE HCL 20 MG PO TABS: 20.0000 mg | ORAL_TABLET | Freq: Two times a day (BID) | ORAL | Status: DC

## 2015-10-12 MED ORDER — ONDANSETRON HCL 4 MG/2ML IJ SOLN: 4.0000 mg | Freq: Once | INTRAMUSCULAR | Status: DC

## 2015-10-12 MED ORDER — ONDANSETRON HCL 4 MG PO TABS: 4.0000 mg | ORAL_TABLET | Freq: Four times a day (QID) | ORAL | Status: DC

## 2015-10-12 MED ORDER — TECHNETIUM TC 99M MEBROFENIN IV KIT
5.0000 | PACK | Freq: Once | INTRAVENOUS | Status: AC | PRN
  Administered 2015-10-12: 4.7 via INTRAVENOUS

## 2015-10-12 MED ORDER — FAMOTIDINE 20 MG PO TABS: 20.0000 mg | ORAL_TABLET | Freq: Two times a day (BID) | ORAL | Status: DC

## 2015-10-12 MED ORDER — MORPHINE SULFATE (PF) 4 MG/ML IV SOLN: 4.0000 mg | Freq: Once | INTRAVENOUS | Status: DC

## 2015-10-12 MED ORDER — STERILE WATER FOR INJECTION IJ SOLN
INTRAMUSCULAR | Status: AC
  Administered 2015-10-12: 1.73 mL
  Filled 2015-10-12: qty 10

## 2015-10-12 NOTE — ED Provider Notes (Signed)
CSN: [id number]     Arrival date & time 10/12/15  0815 History  By signing my name below, I, Iona Beard, attest that this documentation has been prepared under the direction and in the presence of Jacalyn Lefevre, MD.   Electronically Signed: Iona Beard, ED Scribe. 10/12/2015. 11:57 AM   Chief Complaint  Patient presents with  . Abdominal Pain   The history is provided by the patient. No language interpreter was used.   HPI Comments: Dawn Ray is a 30 y.o. female with PMHx of GERD, chronic constipation, and HTN who presents to the Emergency Department complaining of gradual onset, RUQ and epigastric abdominal pain, ongoing for about one week. Pt reports associated diarrhea ongoing for several days. Pt was seen in ED on 10/09/2015 and diagnosed with ovarian cyst. No other associated symptoms noted. No worsening or alleviating factors noted. Pt denies fevers, chills, shortness of breath, chest pain, or any other pertinent symptoms.   Past Medical History  Diagnosis Date  . Hypertension   . Chronic constipation   . GERD (gastroesophageal reflux disease)    Past Surgical History  Procedure Laterality Date  . Appendectomy     Family History  Problem Relation Age of Onset  . Colon cancer Neg Hx    Social History  Substance Use Topics  . Smoking status: Current Every Day Smoker -- 0.50 packs/day    Types: Cigarettes  . Smokeless tobacco: None     Comment: half pack daily  . Alcohol Use: 0.0 oz/week    0 Standard drinks or equivalent per week     Comment: occasionally   OB History    No data available     Review of Systems  Constitutional: Negative for fever and chills.  Respiratory: Negative for shortness of breath.   Cardiovascular: Negative for chest pain.  Gastrointestinal: Positive for abdominal pain and diarrhea.  All other systems reviewed and are negative.    Allergies  Review of patient's allergies indicates no known allergies.  Home Medications    Prior to Admission medications   Medication Sig Start Date End Date Taking? Authorizing Provider  Aspirin-Acetaminophen-Caffeine (GOODY HEADACHE PO) Take 1 Package by mouth daily as needed (headache/pain).   Yes Historical Provider, MD  linaclotide Karlene Einstein) 72 MCG capsule Take 1 capsule (72 mcg total) by mouth daily before breakfast. 09/16/15  Yes Tiffany Kocher, PA-C  losartan (COZAAR) 25 MG tablet Take 25 mg by mouth daily.   Yes Historical Provider, MD  pantoprazole (PROTONIX) 40 MG tablet Take 40 mg by mouth daily.   Yes Historical Provider, MD  dicyclomine (BENTYL) 20 MG tablet Take 1 tablet (20 mg total) by mouth 2 (two) times daily. 10/12/15   Jacalyn Lefevre, MD  famotidine (PEPCID) 20 MG tablet Take 1 tablet (20 mg total) by mouth 2 (two) times daily. 10/12/15   Jacalyn Lefevre, MD  omeprazole (PRILOSEC) 20 MG capsule Take 1 capsule (20 mg total) by mouth daily. Patient not taking: Reported on 10/12/2015 10/09/15   Glynn Octave, MD  ondansetron (ZOFRAN) 4 MG tablet Take 1 tablet (4 mg total) by mouth every 6 (six) hours. 10/12/15   Jacalyn Lefevre, MD   BP 159/100 mmHg  Pulse 73  Temp(Src) 98.5 F (36.9 C) (Oral)  Resp 16  Ht  (1.6 m)  Wt 190 lb (86.183 kg)  BMI 33.67 kg/m2  SpO2 100% Physical Exam  Constitutional: She is oriented to person, place, and time. She appears well-developed and well-nourished. No distress.  HENT:  Head: Normocephalic and atraumatic.  Eyes: EOM are normal.  Neck: Normal range of motion.  Cardiovascular: Normal rate, regular rhythm and normal heart sounds.  Exam reveals no gallop.   No murmur heard. Pulmonary/Chest: Effort normal and breath sounds normal. No respiratory distress. She has no wheezes. She has no rales.  Abdominal: Soft. Bowel sounds are normal. She exhibits no distension. There is tenderness. There is no rebound and no guarding.  RUQ and epigastric abdominal TTP.  Musculoskeletal: Normal range of motion.  Neurological: She is alert and  oriented to person, place, and time.  Skin: Skin is warm and dry.  Psychiatric: She has a normal mood and affect. Judgment normal.  Nursing note and vitals reviewed.   ED Course  Procedures (including critical care time) DIAGNOSTIC STUDIES: Oxygen Saturation is 100% on RA, normal by my interpretation.    COORDINATION OF CARE: 8:35 AM Discussed treatment plan with pt at bedside and pt agreed to plan.  Labs Review Labs Reviewed  COMPREHENSIVE METABOLIC PANEL - Abnormal; Notable for the following:    Glucose, Bld 103 (*)    All other components within normal limits  CBC WITH DIFFERENTIAL/PLATELET  LIPASE, BLOOD    Imaging Review Nm Hepato W/eject Fract  10/12/2015  CLINICAL DATA:  Right upper quadrant pain EXAM: NUCLEAR MEDICINE HEPATOBILIARY IMAGING WITH GALLBLADDER EF TECHNIQUE: Sequential images of the abdomen were obtained out to 60 minutes following intravenous administration of radiopharmaceutical. After slow intravenous infusion of 1.73 micrograms Cholecystokinin, gallbladder ejection fraction was determined. RADIOPHARMACEUTICALS:  4.7 mCi Tc-5062m Choletec IV COMPARISON:  None. FINDINGS: Prompt uptake and biliary excretion of activity by the liver is seen. Gallbladder activity is visualized, consistent with patency of cystic duct. Biliary activity passes into small bowel, consistent with patent common bile duct. Calculated gallbladder ejection fraction is 44%. (At 60 min, normal ejection fraction is greater than 40%.) IMPRESSION: No cystic duct obstruction. No CBD obstruction. Post CCK gallbladder ejection fraction is 44%. Normal gallbladder ejection fraction should be greater than 40%. The patient reported no symptoms after CCK administration. Electronically Signed   By: Natasha MeadLiviu  Pop M.D.   On: 10/12/2015 11:42   I have personally reviewed and evaluated these images and lab results as part of my medical decision-making.   EKG Interpretation None      MDM  PT HAS HAD A RUQ US, CT  ABD/PELVIS, PELVIS US THAT WERE ALL OK OTHER THAN AN OVARIAN CYST WHICH IS NOT THE LOCATION OF PT'S PAIN.  WE WERE ABLE TO GET A HIDA SCAN TODAY WHICH WAS ALSO OK.  PT HAD A NL URINE JUST A FEW DAYS AGO AND SHE DOES NOT WANT TO WAIT TO GET ANOTHER ONE.  I THINK THAT IS OK SINCE SHE JUST HAD A NORMAL ONE RECENTLY.  THE PT IS INSTR TO F/U WITH GI.  I WILL START HER ON PEPCID AND BENTYL TO SEE IF THAT WILL GIVE PT RELIEF.  PT KNOWS TO RETURN IF WORSE. Final diagnoses:  RUQ pain  I personally performed the services described in this documentation, which was scribed in my presence. The recorded information has been reviewed and is accurate.      Jacalyn LefevreJulie Amoy Steeves, MD 10/12/15 [phone]

## 2015-10-12 NOTE — ED Notes (Signed)
Pt still remains in Nuclear Med.

## 2015-10-12 NOTE — ED Notes (Signed)
Pt reports generalized abd pain since last Thursday, was seen in ED on Saturday for the same and had US and CT scans done, dx with cysts on ovaries and sent home.  Pt still c/o abd pain, mild nausea and 1 loose stool this morning.

## 2015-10-12 NOTE — Telephone Encounter (Signed)
Pt's mom called and said pt needs the note for Thursday and Friday. She has follow up appt here on 6/15 and it was moved up from the end of the month. Sending this to Gerrit HallsAnna Sams, NP who seen the pt in the office recently.  Please advise!

## 2015-10-12 NOTE — Telephone Encounter (Signed)
VM was asking for a work note for tomorrow and Friday. [facility].  I called back and LMOM that we never give notes to pt's that have not been seen here. I will forward to extender for advise!

## 2015-10-12 NOTE — ED Notes (Signed)
NM states pt will be in scan for 2 hours

## 2015-10-12 NOTE — Discharge Instructions (Signed)

## 2015-10-12 NOTE — Telephone Encounter (Signed)
Pt's mother called to say that patient had been to the ER over the weekend and again today. She is having a lot of stomach pain. I have moved her OV up to sooner and she is aware of 6/15 OV.  Mother is asking to speak with a nurse about possibly getting a work note for the patient. Call transferred to VM.

## 2015-10-13 ENCOUNTER — Encounter: Payer: Self-pay | Admitting: Gastroenterology

## 2015-10-13 ENCOUNTER — Other Ambulatory Visit: Payer: Self-pay

## 2015-10-13 DIAGNOSIS — R1011 Right upper quadrant pain: Secondary | ICD-10-CM

## 2015-10-13 DIAGNOSIS — Z419 Encounter for procedure for purposes other than remedying health state, unspecified: Secondary | ICD-10-CM

## 2015-10-13 NOTE — Telephone Encounter (Signed)
Pt is aware and states she is already at work. Doesn't need note. States she is ok with being referred to the surgeon.   Pt is aware of appt.

## 2015-10-13 NOTE — Telephone Encounter (Signed)
I did a letter for today only. I haven't seen her in a month. I reviewed her information and US was without gallstones, HIDA low end of normal. Will need EGD likely to be arranged at her visit upcoming. Go ahead and refer to General Surgery for evaluation of elective cholecystectomy but will need EGD to rule out gastritis/PUD as well. EGD can be discussed on 6/15 in office but we can go ahead and refer to surgery now.

## 2015-10-17 ENCOUNTER — Encounter: Payer: Self-pay | Admitting: Obstetrics and Gynecology

## 2015-10-20 ENCOUNTER — Encounter: Payer: Self-pay | Admitting: Nurse Practitioner

## 2015-10-20 ENCOUNTER — Other Ambulatory Visit: Payer: Self-pay

## 2015-10-20 ENCOUNTER — Ambulatory Visit (INDEPENDENT_AMBULATORY_CARE_PROVIDER_SITE_OTHER): Payer: BLUE CROSS/BLUE SHIELD | Admitting: Nurse Practitioner

## 2015-10-20 VITALS — BP 138/83 | HR 88 | Temp 98.4°F | Ht 63.0 in | Wt 199.8 lb

## 2015-10-20 DIAGNOSIS — R1011 Right upper quadrant pain: Secondary | ICD-10-CM | POA: Diagnosis not present

## 2015-10-20 DIAGNOSIS — R110 Nausea: Secondary | ICD-10-CM | POA: Insufficient documentation

## 2015-10-20 NOTE — Progress Notes (Signed)
Referring Provider: Benita StabileHall, John Z, MD Primary Care Physician:  Dwana MelenaZack Hall, MD Primary GI:  Dr. Darrick PennaFields  Chief Complaint  Patient presents with  . Constipation    HPI:   Dawn Ray is a 30 y.o. female who presents for follow-up on constipation. She was last seen in our office on 09/08/2015. This is associated with abdominal pain, nausea. Typically no bowel movement for several days and then diarrhea when she does finally go. No alarm features noted. She was trialed on Linzess 72 g recommended return for follow-up in 6-8 weeks, sample and co-pay were provided. Follow-up phone note states patient requesting a prescription for Linzess which was provided. She was seen in the emergency department on 10/08/2015 and 10/12/2015 for right upper quadrant pain. Noted mild leukocytosis and mild elevation in lipase. CT was normal, right upper quadrant ultrasound normal, HIDA scan normal. Phone note dated 10/13/2015 nodes likely need for EGD to rule out gastritis/peptic ulcer disease. Referral has been made to general surgery for evaluation of elective cholecystectomy.  Today she states her abdominal pain is intermittent, never sure exactly when it'll happen or how bad. Does note worsening after eating. Pain is RUQ to epigastric area. Associated with nausea, no vomiting. Denies hematochezia, melena. Denies fever, chills, unintentional weight loss. Is on Pepcid and Protonix with minimal to no improvement. Not eating is the only thing that seems to help. Also with increased belching which is uncomfortable. Denies chest pain, dyspnea, dizziness, lightheadedness, syncope, near syncope. Denies any other upper or lower GI symptoms.  Past Medical History  Diagnosis Date  . Hypertension   . Chronic constipation   . GERD (gastroesophageal reflux disease)     Past Surgical History  Procedure Laterality Date  . Appendectomy      Current Outpatient Prescriptions  Medication Sig Dispense Refill  .  Aspirin-Acetaminophen-Caffeine (GOODY HEADACHE PO) Take 1 Package by mouth daily as needed (headache/pain).    Marland Kitchen. dicyclomine (BENTYL) 20 MG tablet Take 1 tablet (20 mg total) by mouth 2 (two) times daily. 20 tablet 0  . famotidine (PEPCID) 20 MG tablet Take 1 tablet (20 mg total) by mouth 2 (two) times daily. 30 tablet 0  . linaclotide (LINZESS) 72 MCG capsule Take 1 capsule (72 mcg total) by mouth daily before breakfast. 30 capsule 11  . losartan (COZAAR) 25 MG tablet Take 25 mg by mouth daily.    . ondansetron (ZOFRAN) 4 MG tablet Take 1 tablet (4 mg total) by mouth every 6 (six) hours. 12 tablet 0  . pantoprazole (PROTONIX) 40 MG tablet Take 40 mg by mouth daily.     No current facility-administered medications for this visit.    Allergies as of 10/20/2015  . (No Known Allergies)    Family History  Problem Relation Age of Onset  . Colon cancer Neg Hx     Social History   Social History  . Marital Status: Single    Spouse Name: N/A  . Number of Children: N/A  . Years of Education: N/A   Occupational History  . employed    Social History Main Topics  . Smoking status: Current Every Day Smoker -- 0.50 packs/day    Types: Cigarettes  . Smokeless tobacco: Never Used     Comment: half pack daily  . Alcohol Use: 0.0 oz/week    0 Standard drinks or equivalent per week     Comment: occasionally  . Drug Use: No  . Sexual Activity: Yes  Birth Control/ Protection: None   Other Topics Concern  . None   Social History Narrative    Review of Systems: 10-point ROS negative except as per HPI.   Physical Exam: BP 138/83 mmHg  Pulse 88  Temp(Src) 98.4 F (36.9 C) (Oral)  Ht  (1.6 m)  Wt 199 lb 12.8 oz (90.629 kg)  BMI 35.40 kg/m2 General:   Alert and oriented. Pleasant and cooperative. Well-nourished and well-developed.  Head:  Normocephalic and atraumatic. Eyes:  Without icterus, sclera clear and conjunctiva pink.  Ears:  Normal auditory  acuity. Cardiovascular:  S1, S2 present without murmurs appreciated. Extremities without clubbing or edema. Respiratory:  Clear to auscultation bilaterally. No wheezes, rales, or rhonchi. No distress.  Gastrointestinal:  +BS, soft, and non-distended. Moderate RUQ pain to palpation. No HSM noted. No guarding or rebound. No masses appreciated.  Rectal:  Deferred  Musculoskalatal:  Symmetrical without gross deformities. Neurologic:  Alert and oriented x4;  grossly normal neurologically. Psych:  Alert and cooperative. Normal mood and affect. Heme/Lymph/Immune: No excessive bruising noted.    10/20/2015 9:50 AM   Disclaimer: This note was dictated with voice recognition software. Similar sounding words can inadvertently be transcribed and may not be corrected upon review.

## 2015-10-20 NOTE — Assessment & Plan Note (Signed)
Agent with intermittent right upper quadrant pain, worse with eating and associated with nausea without vomiting. She has had a pretty extensive workup including CT imaging, ultrasound imaging, HIDA scan. Her symptoms persist. She is on Protonix and Pepcid. At this point she is also been referred to a surgeon for consideration of elective cholecystectomy. At this time we will set her up for an upper endoscopy to rule out peptic ulcer disease, H. pylori, gastritis as an etiology to her symptoms while she is awaiting for referral appointment with surgery. This will essentially wrap up her upper GI evaluation. She is recommended to avoid all NSAIDs and aspirin powders.  Proceed with EGD with Dr. Darrick PennaFields in near future: the risks, benefits, and alternatives have been discussed with the patient in detail. The patient states understanding and desires to proceed.  The patient is not on any anticoagulants, anxiolytics, chronic pain medications, or antidepressants. Conscious sedation should be adequate for her procedure.

## 2015-10-20 NOTE — Assessment & Plan Note (Signed)
Noted nausea without vomiting associated with her RUQ abdominal pain. Worse after eating. Surgery referral pending to consider elective cholecystectomy. Further workup as noted below.

## 2015-10-20 NOTE — Progress Notes (Signed)
CC'ED TO PCP 

## 2015-10-20 NOTE — Patient Instructions (Signed)
1. We will schedule your procedure for you. 2. Avoid all NSAIDs (ibuprofen, Advil, Motrin, naproxen, Naprosyn, any other medication with "NSAID" on the package Exline also avoid all aspirin powders such as Goody powder and BC powder. 3. Return for follow-up in 3 months

## 2015-10-27 NOTE — Patient Instructions (Signed)
Carver FilaStephanie D Wubben  10/27/2015     @PREFPERIOPPHARMACY @   Your procedure is scheduled on 10/31/2015  [facility]  Call this number if you have problems the morning of surgery:  [phone]   Remember:  Do not eat food or drink liquids after midnight.  Take these medicines the morning of surgery with A SIP OF WATER Bentyl, Pepcid, Linzess, Cozaar, Zofran if needed, Protonix   Do not wear jewelry, make-up or nail polish.  Do not wear lotions, powders, or perfumes.  You may wear deoderant.  Do not shave 48 hours prior to surgery.  Men may shave face and neck.  Do not bring valuables to the hospital.  [facility] any belongings or valuables.  Contacts, dentures or bridgework may not be worn into surgery.  Leave your suitcase in the car.  After surgery it may be brought to your room.  For patients admitted to the hospital, discharge time will be determined by your treatment team.  Patients discharged the day of surgery will not be allowed to drive home.    Please read over the following fact sheets that you were given. Surgical Site Infection Prevention and Anesthesia Post-op Instructions     PATIENT INSTRUCTIONS POST-ANESTHESIA  IMMEDIATELY FOLLOWING SURGERY:  Do not drive or operate machinery for the first twenty four hours after surgery.  Do not make any important decisions for twenty four hours after surgery or while taking narcotic pain medications or sedatives.  If you develop intractable nausea and vomiting or a severe headache please notify your doctor immediately.  FOLLOW-UP:  Please make an appointment with your surgeon as instructed. You do not need to follow up with anesthesia unless specifically instructed to do so.  WOUND CARE INSTRUCTIONS (if applicable):  Keep a dry clean dressing on the anesthesia/puncture wound site if there is drainage.  Once the wound has quit draining you may leave it open to air.  Generally you  should leave the bandage intact for twenty four hours unless there is drainage.  If the epidural site drains for more than 36-48 hours please call the anesthesia department.  QUESTIONS?:  Please feel free to call your physician or the hospital operator if you have any questions, and they will be happy to assist you.      Laparoscopic Cholecystectomy Laparoscopic cholecystectomy is surgery to remove the gallbladder. The gallbladder is located in the upper right part of the abdomen, behind the liver. It is a storage sac for bile, which is produced in the liver. Bile aids in the digestion and absorption of fats. Cholecystectomy is often done for inflammation of the gallbladder (cholecystitis). This condition is usually caused by a buildup of gallstones (cholelithiasis) in the gallbladder. Gallstones can block the flow of bile, and that can result in inflammation and pain. In severe cases, emergency surgery may be required. If emergency surgery is not required, you will have time to prepare for the procedure. Laparoscopic surgery is an alternative to open surgery. Laparoscopic surgery has a shorter recovery time. Your common bile duct may also need to be examined during the procedure. If stones are found in the common bile duct, they may be removed. LET [facility] PROVIDER KNOW ABOUT:  Any allergies you have.  All medicines you are taking, including vitamins, herbs, eye drops, creams, and over-the-counter medicines.  Previous problems you or members of your family have had with the use of anesthetics.  Any  blood disorders you have.  Previous surgeries you have had.  Any medical conditions you have. RISKS AND COMPLICATIONS Generally, this is a safe procedure. However, problems may occur, including:  Infection.  Bleeding.  Allergic reactions to medicines.  Damage to other structures or organs.  A stone remaining in the common bile duct.  A bile leak from the cyst duct that is  clipped when your gallbladder is removed.  The need to convert to open surgery, which requires a larger incision in the abdomen. This may be necessary if your surgeon thinks that it is not safe to continue with a laparoscopic procedure. BEFORE THE PROCEDURE  Ask your health care provider about:  Changing or stopping your regular medicines. This is especially important if you are taking diabetes medicines or blood thinners.  Taking medicines such as aspirin and ibuprofen. These medicines can thin your blood. Do not take these medicines before your procedure if your health care provider instructs you not to.  Follow instructions from your health care provider about eating or drinking restrictions.  Let your health care provider know if you develop a cold or an infection before surgery.  Plan to have someone take you home after the procedure.  Ask your health care provider how your surgical site will be marked or identified.  You may be given antibiotic medicine to help prevent infection. PROCEDURE  To reduce your risk of infection:  Your health care team will wash or sanitize their hands.  Your skin will be washed with soap.  An IV tube may be inserted into one of your veins.  You will be given a medicine to make you fall asleep (general anesthetic).  A breathing tube will be placed in your mouth.  The surgeon will make several small cuts (incisions) in your abdomen.  A thin, lighted tube (laparoscope) that has a tiny camera on the end will be inserted through one of the small incisions. The camera on the laparoscope will send a picture to a TV screen (monitor) in the operating room. This will give the surgeon a good view inside your abdomen.  A gas will be pumped into your abdomen. This will expand your abdomen to give the surgeon more room to perform the surgery.  Other tools that are needed for the procedure will be inserted through the other incisions. The gallbladder will  be removed through one of the incisions.  After your gallbladder has been removed, the incisions will be closed with stitches (sutures), staples, or skin glue.  Your incisions may be covered with a bandage (dressing). The procedure may vary among health care providers and hospitals. AFTER THE PROCEDURE  Your blood pressure, heart rate, breathing rate, and blood oxygen level will be monitored often until the medicines you were given have worn off.  You will be given medicines as needed to control your pain.   This information is not intended to replace advice given to you by your health care provider. Make sure you discuss any questions you have with your health care provider.   Document Released: 04/23/2005 Document Revised: 01/12/2015 Document Reviewed: 12/03/2012 Elsevier Interactive Patient Education [employer]2016 [employer] Inc[employer]

## 2015-10-27 NOTE — Anesthesia Preprocedure Evaluation (Addendum)
Anesthesia Evaluation  Patient identified by MRN, date of birth, ID band Patient awake    Reviewed: Allergy & Precautions, NPO status , Patient's Chart, lab work & pertinent test results  Airway Mallampati: III  TM Distance: >3 FB Neck ROM: Full    Dental  (+) Teeth Intact   Pulmonary Current Smoker,    breath sounds clear to auscultation       Cardiovascular hypertension, Pt. on medications  Rhythm:Regular Rate:Normal     Neuro/Psych negative neurological ROS  negative psych ROS   GI/Hepatic Neg liver ROS, GERD  Medicated,  Endo/Other  negative endocrine ROS  Renal/GU negative Renal ROS  negative genitourinary   Musculoskeletal negative musculoskeletal ROS (+)   Abdominal   Peds negative pediatric ROS (+)  Hematology negative hematology ROS (+)   Anesthesia Other Findings   Reproductive/Obstetrics negative OB ROS                            Lab Results  Component Value Date   WBC 9.6 10/12/2015   HGB 14.5 10/12/2015   HCT 43.5 10/12/2015   MCV 91.6 10/12/2015   PLT 285 10/12/2015   Lab Results  Component Value Date   CREATININE 0.74 10/12/2015   BUN 10 10/12/2015   NA 137 10/12/2015   K 4.0 10/12/2015   CL 103 10/12/2015   CO2 29 10/12/2015   No results found for: INR, PROTIME  06/2015 EKG: normal EKG, normal sinus rhythm.   Anesthesia Physical Anesthesia Plan  ASA: II  Anesthesia Plan: General   Post-op Pain Management:    Induction: Intravenous  Airway Management Planned: Oral ETT  Additional Equipment:   Intra-op Plan:   Post-operative Plan: Extubation in OR  Informed Consent: I have reviewed the patients History and Physical, chart, labs and discussed the procedure including the risks, benefits and alternatives for the proposed anesthesia with the patient or authorized representative who has indicated his/her understanding and acceptance.   Dental  advisory given  Plan Discussed with: CRNA  Anesthesia Plan Comments:         Anesthesia Quick Evaluation

## 2015-10-28 ENCOUNTER — Encounter (HOSPITAL_COMMUNITY): Payer: Self-pay

## 2015-10-28 ENCOUNTER — Encounter (HOSPITAL_COMMUNITY)
Admission: RE | Admit: 2015-10-28 | Discharge: 2015-10-28 | Disposition: A | Discharge status: Home / Self Care | Payer: BLUE CROSS/BLUE SHIELD | Source: Ambulatory Visit | Attending: Surgery | Admitting: Surgery

## 2015-10-28 DIAGNOSIS — K219 Gastro-esophageal reflux disease without esophagitis: Secondary | ICD-10-CM | POA: Diagnosis not present

## 2015-10-28 DIAGNOSIS — Z79899 Other long term (current) drug therapy: Secondary | ICD-10-CM | POA: Diagnosis not present

## 2015-10-28 DIAGNOSIS — I10 Essential (primary) hypertension: Secondary | ICD-10-CM | POA: Diagnosis not present

## 2015-10-28 DIAGNOSIS — K8010 Calculus of gallbladder with chronic cholecystitis without obstruction: Secondary | ICD-10-CM | POA: Diagnosis not present

## 2015-10-28 DIAGNOSIS — R1011 Right upper quadrant pain: Secondary | ICD-10-CM | POA: Diagnosis present

## 2015-10-28 DIAGNOSIS — F17200 Nicotine dependence, unspecified, uncomplicated: Secondary | ICD-10-CM | POA: Diagnosis not present

## 2015-10-28 DIAGNOSIS — Z01812 Encounter for preprocedural laboratory examination: Secondary | ICD-10-CM | POA: Diagnosis not present

## 2015-10-28 LAB — BASIC METABOLIC PANEL
ANION GAP: 9 (ref 5–15)
BUN: 13 mg/dL (ref 6–20)
CHLORIDE: 106 mmol/L (ref 101–111)
CO2: 24 mmol/L (ref 22–32)
Calcium: 9.2 mg/dL (ref 8.9–10.3)
Creatinine, Ser: 0.69 mg/dL (ref 0.44–1.00)
Glucose, Bld: 79 mg/dL (ref 65–99)
POTASSIUM: 4.1 mmol/L (ref 3.5–5.1)
SODIUM: 139 mmol/L (ref 135–145)

## 2015-10-28 LAB — CBC
HCT: 46 % (ref 36.0–46.0)
HEMOGLOBIN: 15.4 g/dL — AB (ref 12.0–15.0)
MCH: 30.3 pg (ref 26.0–34.0)
MCHC: 33.5 g/dL (ref 30.0–36.0)
MCV: 90.6 fL (ref 78.0–100.0)
PLATELETS: 303 10*3/uL (ref 150–400)
RBC: 5.08 MIL/uL (ref 3.87–5.11)
RDW: 12.2 % (ref 11.5–15.5)
WBC: 9.5 10*3/uL (ref 4.0–10.5)

## 2015-10-28 LAB — HCG, SERUM, QUALITATIVE: PREG SERUM: NEGATIVE

## 2015-10-28 NOTE — Pre-Procedure Instructions (Signed)
Patient given information to sign up for my chart at home. 

## 2015-10-31 ENCOUNTER — Ambulatory Visit (HOSPITAL_COMMUNITY): Payer: BLUE CROSS/BLUE SHIELD | Admitting: Anesthesiology

## 2015-10-31 ENCOUNTER — Encounter (HOSPITAL_COMMUNITY)
Admission: RE | Disposition: A | Discharge status: Home / Self Care | Payer: Self-pay | Source: Ambulatory Visit | Attending: Surgery

## 2015-10-31 ENCOUNTER — Encounter (HOSPITAL_COMMUNITY): Payer: Self-pay | Admitting: Anesthesiology

## 2015-10-31 ENCOUNTER — Ambulatory Visit (HOSPITAL_COMMUNITY)
Admission: RE | Admit: 2015-10-31 | Discharge: 2015-10-31 | Disposition: A | Discharge status: Home / Self Care | Payer: BLUE CROSS/BLUE SHIELD | Source: Ambulatory Visit | Attending: Surgery | Admitting: Surgery

## 2015-10-31 DIAGNOSIS — K219 Gastro-esophageal reflux disease without esophagitis: Secondary | ICD-10-CM | POA: Insufficient documentation

## 2015-10-31 DIAGNOSIS — Z01812 Encounter for preprocedural laboratory examination: Secondary | ICD-10-CM | POA: Insufficient documentation

## 2015-10-31 DIAGNOSIS — I10 Essential (primary) hypertension: Secondary | ICD-10-CM | POA: Insufficient documentation

## 2015-10-31 DIAGNOSIS — K8010 Calculus of gallbladder with chronic cholecystitis without obstruction: Secondary | ICD-10-CM | POA: Diagnosis not present

## 2015-10-31 DIAGNOSIS — F17200 Nicotine dependence, unspecified, uncomplicated: Secondary | ICD-10-CM | POA: Insufficient documentation

## 2015-10-31 DIAGNOSIS — Z79899 Other long term (current) drug therapy: Secondary | ICD-10-CM | POA: Insufficient documentation

## 2015-10-31 HISTORY — PX: CHOLECYSTECTOMY: SHX55

## 2015-10-31 SURGERY — LAPAROSCOPIC CHOLECYSTECTOMY
Anesthesia: General

## 2015-10-31 MED ORDER — MIDAZOLAM HCL 2 MG/2ML IJ SOLN
INTRAMUSCULAR | Status: AC
  Filled 2015-10-31: qty 2

## 2015-10-31 MED ORDER — FENTANYL CITRATE (PF) 100 MCG/2ML IJ SOLN
INTRAMUSCULAR | Status: AC
  Filled 2015-10-31: qty 2

## 2015-10-31 MED ORDER — PROMETHAZINE HCL 25 MG/ML IJ SOLN: 6.2500 mg | INTRAMUSCULAR | Status: DC | PRN

## 2015-10-31 MED ORDER — CHLORHEXIDINE GLUCONATE CLOTH 2 % EX PADS: 6.0000 | MEDICATED_PAD | Freq: Once | CUTANEOUS | Status: DC

## 2015-10-31 MED ORDER — BUPIVACAINE HCL (PF) 0.5 % IJ SOLN
INTRAMUSCULAR | Status: AC
Start: 2015-10-31 — End: 2015-10-31
  Filled 2015-10-31: qty 30

## 2015-10-31 MED ORDER — PROMETHAZINE HCL 25 MG/ML IJ SOLN
INTRAMUSCULAR | Status: AC
  Filled 2015-10-31: qty 1

## 2015-10-31 MED ORDER — MIDAZOLAM HCL 2 MG/2ML IJ SOLN
1.0000 mg | INTRAMUSCULAR | Status: AC | PRN
  Administered 2015-10-31 (×2): 1 mg via INTRAVENOUS

## 2015-10-31 MED ORDER — PROPOFOL 10 MG/ML IV BOLUS
INTRAVENOUS | Status: DC | PRN
  Administered 2015-10-31: 150 mg via INTRAVENOUS
  Administered 2015-10-31: 50 mg via INTRAVENOUS

## 2015-10-31 MED ORDER — OXYCODONE-ACETAMINOPHEN 5-325 MG PO TABS: 1.0000 | ORAL_TABLET | ORAL | Status: DC | PRN

## 2015-10-31 MED ORDER — PROPOFOL 10 MG/ML IV BOLUS
INTRAVENOUS | Status: AC
  Filled 2015-10-31: qty 20

## 2015-10-31 MED ORDER — FENTANYL CITRATE (PF) 250 MCG/5ML IJ SOLN
INTRAMUSCULAR | Status: AC
  Filled 2015-10-31: qty 5

## 2015-10-31 MED ORDER — LIDOCAINE HCL (PF) 1 % IJ SOLN
INTRAMUSCULAR | Status: AC
  Filled 2015-10-31: qty 30

## 2015-10-31 MED ORDER — ROCURONIUM BROMIDE 100 MG/10ML IV SOLN
INTRAVENOUS | Status: DC | PRN
  Administered 2015-10-31 (×2): 5 mg via INTRAVENOUS
  Administered 2015-10-31: 25 mg via INTRAVENOUS

## 2015-10-31 MED ORDER — NEOSTIGMINE METHYLSULFATE 10 MG/10ML IV SOLN
INTRAVENOUS | Status: DC | PRN
  Administered 2015-10-31: 4 mg via INTRAVENOUS

## 2015-10-31 MED ORDER — SUCCINYLCHOLINE CHLORIDE 20 MG/ML IJ SOLN
INTRAMUSCULAR | Status: AC
  Filled 2015-10-31: qty 1

## 2015-10-31 MED ORDER — LACTATED RINGERS IV SOLN
INTRAVENOUS | Status: DC | PRN
  Administered 2015-10-31 (×2): via INTRAVENOUS

## 2015-10-31 MED ORDER — GLYCOPYRROLATE 0.2 MG/ML IJ SOLN
INTRAMUSCULAR | Status: DC | PRN
  Administered 2015-10-31: 0.6 mg via INTRAVENOUS
  Administered 2015-10-31: 0.2 mg via INTRAVENOUS

## 2015-10-31 MED ORDER — ARTIFICIAL TEARS OP OINT
TOPICAL_OINTMENT | OPHTHALMIC | Status: DC | PRN
  Administered 2015-10-31: 1 via OPHTHALMIC

## 2015-10-31 MED ORDER — LIDOCAINE HCL (CARDIAC) 20 MG/ML IV SOLN
INTRAVENOUS | Status: DC | PRN
  Administered 2015-10-31: 50 mg via INTRAVENOUS

## 2015-10-31 MED ORDER — BUPIVACAINE HCL 0.5 % IJ SOLN
INTRAMUSCULAR | Status: DC | PRN
  Administered 2015-10-31: 14 mL

## 2015-10-31 MED ORDER — PROMETHAZINE HCL 25 MG/ML IJ SOLN
6.2500 mg | Freq: Once | INTRAMUSCULAR | Status: AC
  Administered 2015-10-31: 12.5 mg via INTRAVENOUS

## 2015-10-31 MED ORDER — MEPERIDINE HCL 50 MG/ML IJ SOLN: 6.2500 mg | INTRAMUSCULAR | Status: DC | PRN

## 2015-10-31 MED ORDER — FENTANYL CITRATE (PF) 100 MCG/2ML IJ SOLN
INTRAMUSCULAR | Status: DC | PRN
  Administered 2015-10-31 (×4): 50 ug via INTRAVENOUS
  Administered 2015-10-31: 25 ug via INTRAVENOUS
  Administered 2015-10-31: 100 ug via INTRAVENOUS
  Administered 2015-10-31: 25 ug via INTRAVENOUS

## 2015-10-31 MED ORDER — SUCCINYLCHOLINE CHLORIDE 20 MG/ML IJ SOLN
INTRAMUSCULAR | Status: DC | PRN
  Administered 2015-10-31: 140 mg via INTRAVENOUS

## 2015-10-31 MED ORDER — LIDOCAINE HCL (PF) 1 % IJ SOLN
INTRAMUSCULAR | Status: AC
  Filled 2015-10-31: qty 5

## 2015-10-31 MED ORDER — SODIUM CHLORIDE 0.9 % IR SOLN
Status: DC | PRN
  Administered 2015-10-31: 1000 mL

## 2015-10-31 MED ORDER — ROCURONIUM BROMIDE 50 MG/5ML IV SOLN
INTRAVENOUS | Status: AC
Start: 2015-10-31 — End: 2015-10-31
  Filled 2015-10-31: qty 1

## 2015-10-31 MED ORDER — GLYCOPYRROLATE 0.2 MG/ML IJ SOLN
INTRAMUSCULAR | Status: AC
  Filled 2015-10-31: qty 6

## 2015-10-31 MED ORDER — GLYCOPYRROLATE 0.2 MG/ML IJ SOLN
INTRAMUSCULAR | Status: AC
  Filled 2015-10-31: qty 1

## 2015-10-31 MED ORDER — HYDROMORPHONE HCL 1 MG/ML IJ SOLN: 0.2500 mg | INTRAMUSCULAR | Status: DC | PRN

## 2015-10-31 MED ORDER — CEFAZOLIN SODIUM-DEXTROSE 2-4 GM/100ML-% IV SOLN
INTRAVENOUS | Status: AC
  Filled 2015-10-31: qty 100

## 2015-10-31 MED ORDER — CEFAZOLIN SODIUM-DEXTROSE 2-4 GM/100ML-% IV SOLN
2.0000 g | INTRAVENOUS | Status: AC
  Administered 2015-10-31: 2 g via INTRAVENOUS

## 2015-10-31 MED ORDER — LACTATED RINGERS IV SOLN: INTRAVENOUS | Status: DC

## 2015-10-31 SURGICAL SUPPLY — 44 items
ADH SKN CLS APL DERMABOND .7 (GAUZE/BANDAGES/DRESSINGS) ×1
APPLIER CLIP LAPSCP 10X32 DD (CLIP) ×3 IMPLANT
BAG HAMPER (MISCELLANEOUS) ×3 IMPLANT
BAG SPEC RTRVL LRG 6X4 10 (ENDOMECHANICALS) ×1
CHLORAPREP W/TINT 26ML (MISCELLANEOUS) ×3 IMPLANT
CLOTH BEACON ORANGE TIMEOUT ST (SAFETY) ×3 IMPLANT
COVER LIGHT HANDLE STERIS (MISCELLANEOUS) ×6 IMPLANT
DECANTER SPIKE VIAL GLASS SM (MISCELLANEOUS) ×6 IMPLANT
DERMABOND ADVANCED (GAUZE/BANDAGES/DRESSINGS) ×2
DERMABOND ADVANCED .7 DNX12 (GAUZE/BANDAGES/DRESSINGS) ×1 IMPLANT
DEVICE TROCAR PUNCTURE CLOSURE (ENDOMECHANICALS) ×3 IMPLANT
ELECT REM PT RETURN 9FT ADLT (ELECTROSURGICAL) ×3
ELECTRODE REM PT RTRN 9FT ADLT (ELECTROSURGICAL) ×1 IMPLANT
FILTER SMOKE EVAC LAPAROSHD (FILTER) ×3 IMPLANT
FORMALIN 10 PREFIL 120ML (MISCELLANEOUS) ×3 IMPLANT
GLOVE BIOGEL PI IND STRL 7.0 (GLOVE) ×3 IMPLANT
GLOVE BIOGEL PI IND STRL 7.5 (GLOVE) ×1 IMPLANT
GLOVE BIOGEL PI INDICATOR 7.0 (GLOVE) ×6
GLOVE BIOGEL PI INDICATOR 7.5 (GLOVE) ×2
GLOVE ECLIPSE 7.0 STRL STRAW (GLOVE) ×3 IMPLANT
GOWN STRL REUS W/ TWL XL LVL3 (GOWN DISPOSABLE) ×1 IMPLANT
GOWN STRL REUS W/TWL LRG LVL3 (GOWN DISPOSABLE) ×6 IMPLANT
GOWN STRL REUS W/TWL XL LVL3 (GOWN DISPOSABLE) ×3
HEMOSTAT SNOW SURGICEL 2X4 (HEMOSTASIS) ×3 IMPLANT
INST SET LAPROSCOPIC AP (KITS) ×3 IMPLANT
IV NS IRRIG 3000ML ARTHROMATIC (IV SOLUTION) IMPLANT
KIT ROOM TURNOVER APOR (KITS) ×3 IMPLANT
MANIFOLD NEPTUNE II (INSTRUMENTS) ×3 IMPLANT
NEEDLE INSUFFLATION 14GA 120MM (NEEDLE) ×3 IMPLANT
NS IRRIG 1000ML POUR BTL (IV SOLUTION) ×3 IMPLANT
PACK LAP CHOLE LZT030E (CUSTOM PROCEDURE TRAY) ×3 IMPLANT
PAD ARMBOARD 7.5X6 YLW CONV (MISCELLANEOUS) ×3 IMPLANT
POUCH SPECIMEN RETRIEVAL 10MM (ENDOMECHANICALS) ×3 IMPLANT
SET BASIN LINEN APH (SET/KITS/TRAYS/PACK) ×3 IMPLANT
SET TUBE IRRIG SUCTION NO TIP (IRRIGATION / IRRIGATOR) IMPLANT
SLEEVE ENDOPATH XCEL 5M (ENDOMECHANICALS) ×6 IMPLANT
SUT VIC AB 4-0 PS2 27 (SUTURE) ×3 IMPLANT
SUT VICRYL 0 UR6 27IN ABS (SUTURE) ×3 IMPLANT
SUT VICRYL AB 3-0 FS1 BRD 27IN (SUTURE) ×3 IMPLANT
TROCAR ENDO BLADELESS 11MM (ENDOMECHANICALS) ×3 IMPLANT
TROCAR XCEL NON-BLD 5MMX100MML (ENDOMECHANICALS) ×3 IMPLANT
TUBING INSUFFLATION (TUBING) ×3 IMPLANT
WARMER LAPAROSCOPE (MISCELLANEOUS) ×3 IMPLANT
YANKAUER SUCT 12FT TUBE ARGYLE (SUCTIONS) ×3 IMPLANT

## 2015-10-31 NOTE — Interval H&P Note (Signed)
History and Physical Interval Note:  10/31/2015 9:09 AM  [name]  has presented today for surgery, with the diagnosis of cholelithiasis  The various methods of treatment have been discussed with the patient and family. After consideration of risks, benefits and other options for treatment, the patient has consented to  Procedure(s): LAPAROSCOPIC CHOLECYSTECTOMY (N/A) as a surgical intervention .  The patient's history has been reviewed, patient examined, no change in status, stable for surgery.  I have reviewed the patient's chart and labs.  Questions were answered to the patient's satisfaction.     Ancil LinseyJason Evan Treana Lacour

## 2015-10-31 NOTE — H&P (Signed)
Surgical History & Physical  Subjective: This 30 year old female presents forconsistently post-prandial RUQ abdominal pain that she reports is worse after eating fatty meals such as meats, cheese, and fried foods and better if she avoids such foods. Because no acute or pathologic findings were discovered on CT abdomen and pelvis (10/09/2015), ultrasound (10/09/2015), or HIDA scan with CCK (10/12/2015), patient was referred to gastroenterologist, Dr. Darrick PennaFields, who reportedly evaluated her for and excluded gastric pathology. Patient is referred for elective cholecystectomy in this context. Patient additionally reports intermittent constipation and diarrhea, which is followed by Dr. Darrick PennaFields who recently prescribed patient Linzess 72 mcg for this.  Review of Symptoms: Constitutional:No fevers, chills, or unexplained weight loss Head:Atraumatic; no masses; no abnormalities Eyes:No visual changes or eye pain Nose/Mouth/Throat: No nasal congestion, rhinorrhea, oral lesions, postnasal drip or sore throat  Cardiovascular: No chest pain or palpitations.  Respiratory:No cough, shortness of breath or wheezing  Gastrointestinal: RUQ abdominal pain, diarrhea, and constipation as per HPI, heartburn controlled, denies blood in stools or vomiting Genitourinary:No urinary frequency, hematuria, incontinence, or dysuria Musculoskeletal:No arthalgias, myalgias or joint swelling Skin:No rash or bothersome skin lesions Hematolgic/Lymphatic:No easy bruising, easy bleeding or swollen glands  Past Medical History  Surgical History: Appendectomy (1990), EGD Darrick Penna(Fields, [place] - 2017) Medical Problems:  GERD, HTN, IBD (intermittent constipation + diarrhea)  Social History  Preferred Language: English Race:  White Ethnicity: Not Hispanic / Latino Age: [age] year Marital Status:  S  Smoking Status: Current every day smoker reviewed on 10/25/2015 Started Date: 06/20/2001Packs per day: 0.50  Functional Status reviewed  on 10/25/2015 ------------------------------------------------ Bathing: Normal Cooking: Normal Dressing: Normal Driving: Normal Eating: Normal Managing Meds: Normal Oral Care: Normal Shopping: Normal Toileting: Normal Transferring: Normal Walking: Normal  Cognitive Status reviewed on 10/25/2015 ------------------------------------------------ Attention: Normal Decision Making: Normal Language: Normal Memory: Normal Motor: Normal Perception: Normal Problem Solving: Normal Visual and Spatial: Normal  Family Health History Mother, Living; Healthy; None reported Father, Living; Hypertension (high blood pressure);   Vital Signs:  Vitals as of: 10/25/2015: Systolic 162: Diastolic 131: Heart Rate 91: Temp 97.49F (Temporal): Height 495ft 3in: Weight 199Lbs 0 Ounces: Pain Level 1: BMI 35.25   BMI : 35.25 kg/m2  Objective Information: General:Well appearing, well nourished in no distress. Skin:no rash or prominent lesions Head:Atraumatic; no masses; no abnormalities Eyes:conjunctiva clear, EOM intact, PERRL Mouth:Mucous membranes moist, no mucosal lesions. Throat:no erythema, exudates or lesions. Neck:Supple without lymphadenopathy.  Heart:RRR, no murmur Lungs:CTA bilaterally, no wheezes, rhonchi, rales.  Breathing unlabored. Abdomen:Soft, overweight, RUQ mild-/moderately tender to palpation with (+) Murphy's sign, no HSM, no masses. Extremities:No deformities, clubbing, cyanosis, or edema.  30 year old Female with RUQ pain, possible microcholelithiasis.  Plan: - Patient advised cholecystectomy in this context (history consistent with gallbladder disease and no findings of gastric disease, no studies to support gallbladder disease) can be diagnostic as well as potentially therapeutic, but that relief of symptoms is not guaranteed - All risks, benefits, and alternatives to cholecystectomy were discussed with the patient, who elected to proceed, and all of her's  and her mother's questions were answered to their expressed satisfaction - Will plan for elective outpatient laparoscopic cholecystectomy with follow-up 2 weeks after surger

## 2015-10-31 NOTE — Op Note (Addendum)
SURGICAL OPERATIVE REPORT   DATE OF PROCEDURE: 10/31/2015   ATTENDING  Surgeon(s): Ancil LinseyJason Evan Jnae Thomaston, MD  ASSISTANT(S): Franky MachoMark Jenkins, MD  ANESTHESIA: GETA  PRE-OPERATIVE DIAGNOSIS: Right Upper Quadrant Abdominal Pain  POST-OPERATIVE DIAGNOSIS: Chronic Cholecystitis  PROCEDURE(S):  1.) Laparoscopic Cholecystectomy  INTRAOPERATIVE FINDINGS: Severe peri-cholecystic inflammation with extensive omental adhesions   INTRAOPERATIVE FLUIDS: 1600 mL crystalloid   ESTIMATED BLOOD LOSS: Minimal (<30 mL)   URINE OUTPUT: No foley  SPECIMENS: Gallbladder  IMPLANTS: None  DRAINS: None   COMPLICATIONS: None apparent   CONDITION AT COMPLETION: Hemodynamically stable and extubated  DISPOSITION: PACU   INDICATION(S) FOR PROCEDURE:  Patient is a 30 y.o. female who previously presented with recurrent post-prandial Right upper quadrant abdominal pain that has been worse with fatty foods and relieved by avoiding any fat in her diet. Ultrasound did not demonstrate obvious gallstones or acute cholecystitis, and ejection fraction was normal on HIDA scan. Patient was referred to GI for evaluation of GERD/PUD, and upon evaluation for gastric pathology, patient was referred for surgical evaluation, cholecystectomy. All risks, benefits, and alternatives to above elective procedures were discussed with the patient, who elected to proceed, and informed consent was accordingly obtained at that time.   DETAILS OF PROCEDURE:  Patient was brought to the operating suite and appropriately identified. General anesthesia was administered along with peri-operative prophylactic IV antibiotics, and endotracheal intubation was performed by anesthesiologist, along with NG/OG tube for gastric decompression. In supine position, operative site was prepped and draped in usual sterile fashion, and following a brief time out, initial 5 mm incision was made in a natural skin crease just above the umbilicus. Fascia was then  elevated, and a Verress needle was inserted and its proper position confirmed using aspiration and saline meniscus test.  Upon insufflation of the abdominal cavity with carbon dioxide to a well-tolerated pressure of 12-15 mmHg, 5 mm peri-umbilical port followed by laparoscope were inserted and used to inspect the abdominal cavity and its contents with no injuries from insertion of the first trochar noted. Three additional trocars were inserted, one at the epigastric position (10 mm) and two along the Right costal margin (5 mm). The table was then placed in reverse Trendelenburg position with the Right side up. Extensive filmy and some dense adhesive bands were lysed using sharp dissection and electrocautery, and filmy adhesions between the gallbladder and omentum/duodenum/transverse colon were lysed using combined blunt and sharp dissection.The apex/dome of the gallbladder was grasped with an atraumatic grasper passed through the lateral port and retracted apically over the liver, and the infundibulum was also grasped and retracted. Due to relative difficulty exposing the cystic duct and artery due to significant omental adhesive attachments low on the gallbladder infundibulum, cystic duct, and liver, the peritoneum overlying the gallbladder wall and infundibulum were scored/incised and dissected free of surrounding peritoneal attachments, ultimately revealing the cystic duct and cystic artery, which were clipped twice on the patient side and once on the gallbladder specimen side close to the gallbladder. The remainder of the gallbladder was then dissected from its peritoneal attachments to the liver using electrocautery, and the gallbladder was placed into a laparoscopic specimen bag and removed from the abdominal cavity via the epigastric port site. Hemostasis and secure placement of clips were confirmed, and intra-peritoneal cavity was inspected with no additional findings. Endoclose laparoscopic fascial  closure device was then used to re-approximate fascia at the 10 mm epigastric port site.  All ports were then removed under direct visualization, and abdominal cavity was  desuflated. All port sites were irrigated/cleaned, additional local anesthetic was injected at each incision, 3-0 Vicryl was used to re-approximate dermis at 10 mm port site(s), and subcuticular 4-0 Vicryl suture was used to re-approximate skin. Skin was then cleaned, dried, and sterile skin glue was applied. Patient was then safely able to be awakened, extubated, and transferred to PACU for post-operative monitoring and care.   I was present for all aspects of procedure, and there were no intra-operative complications apparent.

## 2015-10-31 NOTE — Anesthesia Procedure Notes (Signed)
Procedure Name: Intubation Date/Time: 10/31/2015 9:50 AM Performed by: Pernell DupreADAMS, AMY A Pre-anesthesia Checklist: Patient identified, Patient being monitored, Timeout performed, Emergency Drugs available and Suction available Patient Re-evaluated:Patient Re-evaluated prior to inductionOxygen Delivery Method: Circle system utilized Preoxygenation: Pre-oxygenation with 100% oxygen Intubation Type: IV induction, Rapid sequence and Cricoid Pressure applied Ventilation: Mask ventilation without difficulty Laryngoscope Size: 3 and Miller Grade View: Grade I Tube type: Oral Tube size: 7.0 mm Number of attempts: 1 Airway Equipment and Method: Stylet Placement Confirmation: ETT inserted through vocal cords under direct vision,  positive ETCO2 and breath sounds checked- equal and bilateral Secured at: 21 cm Tube secured with: Tape Dental Injury: Teeth and Oropharynx as per pre-operative assessment

## 2015-10-31 NOTE — Transfer of Care (Signed)
Immediate Anesthesia Transfer of Care Note  Patient: Dawn Ray  Procedure(s) Performed: Procedure(s): LAPAROSCOPIC CHOLECYSTECTOMY (N/A)  Patient Location: PACU  Anesthesia Type:General  Level of Consciousness: awake, alert  and oriented  Airway & Oxygen Therapy: Patient Spontanous Breathing and Patient connected to face mask oxygen  Post-op Assessment: Report given to RN  Post vital signs: Reviewed and stable  Last Vitals:  Filed Vitals:   10/31/15 0825 10/31/15 0830  BP: 138/90 137/82  Pulse:    Temp:    Resp: 14 12    Last Pain: There were no vitals filed for this visit.    Patients Stated Pain Goal: 9 (10/31/15 0748)  Complications: No apparent anesthesia complications

## 2015-10-31 NOTE — Discharge Instructions (Signed)
In addition to included general post-operative instructions for laparoscopic cholecystectomy,  Diet: Resume home heart healthy diet.   Activity: No heavy lifting (children, pets, laundry) or strenuous activity until follow-up, but light activity and walking are encouraged. Do not drive or drink alcohol if taking narcotic pain medications.   Wound care: May shower/get incision wet with soapy water and pat dry (do not rub incisions) 2 days after surgery (Wednesday, 6/28), but no baths or submerging incision underwater until follow-up.  Medications: Resume all home medications. For mild to moderate pain: acetaminophen (Tylenol) or ibuprofen (if no kidney disease). Narcotic pain medications, if prescribed, can be used for severe pain, though may cause nausea, constipation, and drowsiness. Do not combine Tylenol and Percocet within a 6 hour period as Percocet contains Tylenol.  Call office [phone]) at any time if any questions, worsening pain, fevers/chills, bleeding, drainage from incision site, or other concerns.     PATIENT INSTRUCTIONS POST-ANESTHESIA  IMMEDIATELY FOLLOWING SURGERY:  Do not drive or operate machinery for the first twenty four hours after surgery.  Do not make any important decisions for twenty four hours after surgery or while taking narcotic pain medications or sedatives.  If you develop intractable nausea and vomiting or a severe headache please notify your doctor immediately.  FOLLOW-UP:  Please make an appointment with your surgeon as instructed. You do not need to follow up with anesthesia unless specifically instructed to do so.  WOUND CARE INSTRUCTIONS (if applicable):  Keep a dry clean dressing on the anesthesia/puncture wound site if there is drainage.  Once the wound has quit draining you may leave it open to air.  Generally you should leave the bandage intact for twenty four hours unless there is drainage.  If the epidural site drains for more than 36-48 hours  please call the anesthesia department.  QUESTIONS?:  Please feel free to call your physician or the hospital operator if you have any questions, and they will be happy to assist you.

## 2015-10-31 NOTE — Anesthesia Postprocedure Evaluation (Signed)
Anesthesia Post Note  Patient: Dawn FilaStephanie D Ray  Procedure(s) Performed: Procedure(s) (LRB): LAPAROSCOPIC CHOLECYSTECTOMY (N/A)  Patient location during evaluation: PACU Anesthesia Type: General Level of consciousness: awake and alert Pain management: pain level controlled Vital Signs Assessment: post-procedure vital signs reviewed and stable Respiratory status: spontaneous breathing, nonlabored ventilation, respiratory function stable and patient connected to nasal cannula oxygen Cardiovascular status: blood pressure returned to baseline and stable Postop Assessment: no signs of nausea or vomiting Anesthetic complications: no    Last Vitals:  Filed Vitals:   10/31/15 1115 10/31/15 1130  BP: 130/78 127/80  Pulse: 88 72  Temp: 36.8 C   Resp: 22 14    Last Pain: There were no vitals filed for this visit.               Shelton SilvasKevin D Mliss Wedin

## 2015-11-01 ENCOUNTER — Telehealth: Payer: Self-pay | Admitting: Gastroenterology

## 2015-11-01 ENCOUNTER — Encounter (HOSPITAL_COMMUNITY): Payer: Self-pay | Admitting: Surgery

## 2015-11-01 NOTE — Telephone Encounter (Signed)
Noted  

## 2015-11-01 NOTE — Telephone Encounter (Signed)
Pt called to cancel her EGD for tomorrow. She had her GB taken out.

## 2015-11-02 ENCOUNTER — Encounter (HOSPITAL_COMMUNITY): Admission: RE | Payer: Self-pay | Source: Ambulatory Visit

## 2015-11-02 ENCOUNTER — Ambulatory Visit (HOSPITAL_COMMUNITY)
Admission: RE | Admit: 2015-11-02 | Payer: BLUE CROSS/BLUE SHIELD | Source: Ambulatory Visit | Admitting: Gastroenterology

## 2015-11-02 SURGERY — EGD (ESOPHAGOGASTRODUODENOSCOPY)
Anesthesia: Moderate Sedation

## 2015-11-03 ENCOUNTER — Ambulatory Visit: Payer: BLUE CROSS/BLUE SHIELD | Admitting: Gastroenterology

## 2016-01-23 ENCOUNTER — Ambulatory Visit: Payer: BLUE CROSS/BLUE SHIELD | Admitting: Nurse Practitioner

## 2016-01-23 ENCOUNTER — Encounter: Payer: Self-pay | Admitting: Nurse Practitioner

## 2016-01-23 ENCOUNTER — Telehealth: Payer: Self-pay | Admitting: Nurse Practitioner

## 2016-01-23 NOTE — Telephone Encounter (Signed)
PT WAS A NO SHOW AND LETTER SENT  °

## 2016-01-24 NOTE — Telephone Encounter (Signed)
Noted  

## 2016-04-10 ENCOUNTER — Ambulatory Visit: Payer: Self-pay

## 2016-04-10 ENCOUNTER — Other Ambulatory Visit: Payer: Self-pay | Admitting: Occupational Medicine

## 2016-04-10 DIAGNOSIS — M79642 Pain in left hand: Secondary | ICD-10-CM

## 2016-04-16 ENCOUNTER — Ambulatory Visit (INDEPENDENT_AMBULATORY_CARE_PROVIDER_SITE_OTHER): Payer: BLUE CROSS/BLUE SHIELD | Admitting: Nurse Practitioner

## 2016-04-16 ENCOUNTER — Encounter: Payer: Self-pay | Admitting: Nurse Practitioner

## 2016-04-16 VITALS — BP 144/95 | HR 94 | Temp 98.4°F | Ht 63.0 in | Wt 208.0 lb

## 2016-04-16 DIAGNOSIS — R1011 Right upper quadrant pain: Secondary | ICD-10-CM

## 2016-04-16 DIAGNOSIS — R194 Change in bowel habit: Secondary | ICD-10-CM | POA: Insufficient documentation

## 2016-04-16 DIAGNOSIS — K219 Gastro-esophageal reflux disease without esophagitis: Secondary | ICD-10-CM | POA: Insufficient documentation

## 2016-04-16 DIAGNOSIS — R110 Nausea: Secondary | ICD-10-CM

## 2016-04-16 MED ORDER — DEXLANSOPRAZOLE 60 MG PO CPDR: 60.0000 mg | DELAYED_RELEASE_CAPSULE | Freq: Every day | ORAL | 2 refills | Status: DC

## 2016-04-16 NOTE — Assessment & Plan Note (Signed)
Symptoms resolved after cholecystectomy. Continue to monitor.

## 2016-04-16 NOTE — Progress Notes (Signed)
Referring Provider: Benita StabileHall, John Z, MD Primary Care Physician:  Dwana MelenaZack Hall, MD Primary GI:  Dr. Darrick PennaFields  Chief Complaint  Patient presents with  . Abdominal Pain  . Nausea    HPI:   [name] is a 30 y.o. female who presents for right upper quadrant pain, nausea, vomiting. The patient was previously seen in our office 10/20/2015 for right upper quadrant pain and nausea. At that time she noted intermittent abdominal pain worse after eating in the right upper quadrant epigastric area associated with nausea but without vomiting. Was on Pepcid and Protonix with minimal to no improvement increased belching noted "uncomfortable." Review his workup included CT imaging, ultrasound imaging, HIDA scan. Symptoms persistent despite PPI and better control of GERD symptoms. Her HIDA scan did note decreased ejection fraction of 44% although technically normal as normal ejection fraction should be 40%. and initial referral to surgery indicated need to optimize GERD/gastritis management to ensure this is not masquerading her symptoms.  Recommended EGD to further evaluate. She was also re-referred back to surgery for consideration of laparoscopic cholecystectomy. Cholecystectomy completed 10/31/2015. EGD has since been canceled. Operative note indicates intraoperative findings of severe area cholecystic inflammation with extensive omental adhesions. Surgical pathology of the gallbladder from chronic cholecystitis and cholestyramine doses.  Today she states she's doing well overall. Had surgery and has not had abdominal pain since having her gallbladder out. Since surgery she has had some increased diarrhea and bowl irregularity. Also, her reflux has started flaring up again, symptoms returned about 2-3 weeks ago. The other night she woke up in the night with "that throw-up taste in my mouth." Also with esophageal burning. She is on Protonix daily and Pepcid in the evening. Denies other abdominal pain,  hematochezia, melena. Denies any other upper or lower GI symptoms.  Denies NSAIDs and ASA powders. At this point she has tried and failed omeprazole, pantoprazole, and famotadine.  Past Medical History:  Diagnosis Date  . Chronic constipation   . GERD (gastroesophageal reflux disease)   . Hypertension     Past Surgical History:  Procedure Laterality Date  . APPENDECTOMY    . CHOLECYSTECTOMY N/A 10/31/2015   Procedure: LAPAROSCOPIC CHOLECYSTECTOMY;  Surgeon: Ancil LinseyJason Evan Davis, MD;  Location: [facility];  Service: General;  Laterality: N/A;    Current Outpatient Prescriptions  Medication Sig Dispense Refill  . losartan (COZAAR) 25 MG tablet Take 25 mg by mouth daily.    . pantoprazole (PROTONIX) 40 MG tablet Take 40 mg by mouth daily.    Marland Kitchen. dicyclomine (BENTYL) 20 MG tablet Take 1 tablet (20 mg total) by mouth 2 (two) times daily. (Patient not taking: Reported on 04/16/2016) 20 tablet 0  . famotidine (PEPCID) 20 MG tablet Take 1 tablet (20 mg total) by mouth 2 (two) times daily. (Patient not taking: Reported on 04/16/2016) 30 tablet 0  . linaclotide (LINZESS) 72 MCG capsule Take 1 capsule (72 mcg total) by mouth daily before breakfast. (Patient not taking: Reported on 04/16/2016) 30 capsule 11  . naproxen sodium (ANAPROX) 220 MG tablet Take 220 mg by mouth daily as needed (headache).    . ondansetron (ZOFRAN) 4 MG tablet Take 1 tablet (4 mg total) by mouth every 6 (six) hours. (Patient not taking: Reported on 04/16/2016) 12 tablet 0  . oxyCODONE-acetaminophen (ROXICET) 5-325 MG tablet Take 1 tablet by mouth every 4 (four) hours as needed for severe pain. (Patient not taking: Reported on 04/16/2016) 30 tablet 0   No current facility-administered  medications for this visit.     Allergies as of 04/16/2016  . (No Known Allergies)    Family History  Problem Relation Age of Onset  . Colon cancer Neg Hx     Social History   Social History  . Marital status: Single    Spouse name: N/A  .  Number of children: N/A  . Years of education: N/A   Occupational History  . employed    Social History Main Topics  . Smoking status: Current Every Day Smoker    Packs/day: 0.50    Years: 5.00    Types: Cigarettes  . Smokeless tobacco: Never Used     Comment: half pack daily  . Alcohol use 0.0 oz/week     Comment: occasionally  . Drug use: No  . Sexual activity: Yes    Birth control/ protection: None   Other Topics Concern  . None   Social History Narrative  . None    Review of Systems: Complete ROS negative except as per HPI.   Physical Exam: BP (!) 144/95   Pulse 94   Temp 98.4 F (36.9 C) (Oral)   Ht 5\' 3"  (1.6 m)   Wt 208 lb (94.3 kg)   LMP  (LMP Unknown)   BMI 36.85 kg/m  General:   Obese female. Alert and oriented. Pleasant and cooperative. Well-nourished and well-developed.  Eyes:  Without icterus, sclera clear and conjunctiva pink.  Ears:  Normal auditory acuity. Cardiovascular:  S1, S2 present without murmurs appreciated. Extremities without clubbing or edema. Respiratory:  Clear to auscultation bilaterally. No wheezes, rales, or rhonchi. No distress.  Gastrointestinal:  +BS, soft, non-tender and non-distended. No HSM noted. No guarding or rebound. No masses appreciated.  Rectal:  Deferred  Musculoskalatal:  Symmetrical without gross deformities. Neurologic:  Alert and oriented x4;  grossly normal neurologically. Psych:  Alert and cooperative. Normal mood and affect. Heme/Lymph/Immune: No excessive bruising noted.    04/16/2016 11:07 AM   Disclaimer: This note was dictated with voice recognition software. Similar sounding words can inadvertently be transcribed and may not be corrected upon review.

## 2016-04-16 NOTE — Patient Instructions (Signed)
1. I sent in a prescription for Dexilant 60 mg. Take this once a day, 20-30 minutes before your first meal the day. 2. When he started taking Dexilant, stop taking Protonix and Pepcid. 3. Dexilant should cover your symptoms for 24 hours but he still have nighttime breakthrough symptoms she can take Pepcid as needed in the evenings. 4. Call with any worsening or severe symptoms. 5. Return for follow-up in 3 months.

## 2016-04-16 NOTE — Assessment & Plan Note (Signed)
Patient with exacerbation of GERD symptoms several weeks prior including burning, bitter sour taste in her mouth, regurgitation during the nighttime. She is previously taken and failed omeprazole. She is currently on Protonix and Prilosec. This point I'll have her stop Protonix and Prilosec and start Dexon 60 mg once a day. We do not have any samples in office, but I will provide her with a co-pay assistants card. Call with any problems or worsening symptoms, or severe symptoms. Return for follow-up in 3 months.

## 2016-04-16 NOTE — Assessment & Plan Note (Signed)
The patient previously was noted to have chronic constipation. However, since having her gallbladder removed she has had increased diarrhea with urgency. This is not all the time, generally intermittent. Discussed that after cholecystectomy can take time for her system to adjust. She can use over-the-counter diarrhea medications as needed. Continue to monitor, call with worsening symptoms or symptoms that persist excessively. Return for follow-up in 3 months to reevaluate.

## 2016-04-16 NOTE — Progress Notes (Signed)
cc'ed to pcp °

## 2016-04-16 NOTE — Assessment & Plan Note (Signed)
Symptoms resolved after cholecystectomy, continue to monitor.

## 2016-04-28 NOTE — Progress Notes (Signed)
REVIEWED-WEIGHT UP 9 LBS SINCE JUN 2017, BMI 36, SMOKES. PLEASE CALL PT. IF SHE WANTS BETTER REFLUX CONTROL, SHE NEEDS TO LOSE 20-30 LBS, AVOID REFLUX TRIGGERS, AND TAKE A PPI.

## 2016-05-31 ENCOUNTER — Other Ambulatory Visit: Payer: Self-pay | Admitting: Obstetrics and Gynecology

## 2016-06-13 ENCOUNTER — Ambulatory Visit (INDEPENDENT_AMBULATORY_CARE_PROVIDER_SITE_OTHER): Payer: BLUE CROSS/BLUE SHIELD | Admitting: Women's Health

## 2016-06-13 ENCOUNTER — Encounter: Payer: Self-pay | Admitting: Women's Health

## 2016-06-13 ENCOUNTER — Other Ambulatory Visit (HOSPITAL_COMMUNITY)
Admission: RE | Admit: 2016-06-13 | Discharge: 2016-06-13 | Disposition: A | Discharge status: Home / Self Care | Payer: BLUE CROSS/BLUE SHIELD | Source: Ambulatory Visit | Attending: Obstetrics and Gynecology | Admitting: Obstetrics and Gynecology

## 2016-06-13 VITALS — BP 160/90 | HR 86 | Ht 63.0 in | Wt 214.0 lb

## 2016-06-13 DIAGNOSIS — Z01419 Encounter for gynecological examination (general) (routine) without abnormal findings: Secondary | ICD-10-CM | POA: Insufficient documentation

## 2016-06-13 DIAGNOSIS — I10 Essential (primary) hypertension: Secondary | ICD-10-CM | POA: Insufficient documentation

## 2016-06-13 DIAGNOSIS — L732 Hidradenitis suppurativa: Secondary | ICD-10-CM | POA: Insufficient documentation

## 2016-06-13 DIAGNOSIS — N910 Primary amenorrhea: Secondary | ICD-10-CM | POA: Insufficient documentation

## 2016-06-13 DIAGNOSIS — Z1151 Encounter for screening for human papillomavirus (HPV): Secondary | ICD-10-CM | POA: Insufficient documentation

## 2016-06-13 DIAGNOSIS — Z113 Encounter for screening for infections with a predominantly sexual mode of transmission: Secondary | ICD-10-CM | POA: Diagnosis present

## 2016-06-13 DIAGNOSIS — E669 Obesity, unspecified: Secondary | ICD-10-CM | POA: Insufficient documentation

## 2016-06-13 DIAGNOSIS — I152 Hypertension secondary to endocrine disorders: Secondary | ICD-10-CM | POA: Insufficient documentation

## 2016-06-13 DIAGNOSIS — F17200 Nicotine dependence, unspecified, uncomplicated: Secondary | ICD-10-CM | POA: Insufficient documentation

## 2016-06-13 NOTE — Progress Notes (Signed)
Subjective:   Dawn Ray is a 31 y.o. G0P0000 Caucasian female here for a routine well-woman exam.  No LMP recorded. Patient is not currently having periods (Reason: Irregular Periods).    Current complaints: has never had a period on her own, many years ago went to ob/gyn in [place] who gave her 'horomones' which did make her have w/drawal bleed. Has never been on birth control. Never any testing/work-up to find out why not having periods. Does report excess hair growth face/chin, some acne, denies hair loss.  Dating same man x 9218yrs, live together, does want to eventually have a baby before she is 34yo Has frequent boils under breasts and in groin area, none active now Does have CHTN managed by pcp, takes cozaar at night, so hasn't taken today PCP: Dr. Margo AyeHall       Does desire labs including STI screening  Social History: Sexual: heterosexual Marital Status: dating x 2918yrs Living situation: w/ boyfriend Occupation: makes gas pumps at [employer] Tobacco/alcohol: smokes 1/2ppd, etoh: occ Illicit drugs: no history of illicit drug use  The following portions of the patient's history were reviewed and updated as appropriate: allergies, current medications, past family history, past medical history, past social history, past surgical history and problem list.  Past Medical History Past Medical History:  Diagnosis Date  . Chronic constipation   . GERD (gastroesophageal reflux disease)   . Hypertension     Past Surgical History Past Surgical History:  Procedure Laterality Date  . APPENDECTOMY    . CHOLECYSTECTOMY N/A 10/31/2015   Procedure: LAPAROSCOPIC CHOLECYSTECTOMY;  Surgeon: Ancil LinseyJason Evan Davis, MD;  Location: [facility];  Service: General;  Laterality: N/A;    Gynecologic History G0P0000  No LMP recorded. Patient is not currently having periods (Reason: Irregular Periods). Contraception: none Last Pap: 'a long time ago'. Results were: normal Last mammogram: never. Results were:  n/a Last TCS: never  Obstetric History OB History  Gravida Para Term Preterm AB Living  0 0 0 0 0 0  SAB TAB Ectopic Multiple Live Births  0 0 0 0 0        Current Medications Current Outpatient Prescriptions on File Prior to Visit  Medication Sig Dispense Refill  . losartan (COZAAR) 25 MG tablet Take 25 mg by mouth daily.    . pantoprazole (PROTONIX) 40 MG tablet Take 40 mg by mouth daily.     No current facility-administered medications on file prior to visit.     Review of Systems Patient denies any headaches, blurred vision, shortness of breath, chest pain, abdominal pain, problems with bowel movements, urination, or intercourse.  Objective:  BP (!) 160/90 (BP Location: Left Arm, Patient Position: Sitting, Cuff Size: Large)   Pulse 86   Ht 5\' 3"  (1.6 m)   Wt 214 lb (97.1 kg)   BMI 37.91 kg/m  Physical Exam  General:  Well developed, well nourished, no acute distress. She is alert and oriented x3. Skin:  Warm and dry, multiple scars under breasts/in groin from previous boils c/w hidradenitis Neck:  Midline trachea, no thyromegaly or nodules Cardiovascular: Regular rate and rhythm, no murmur heard Lungs:  Effort normal, all lung fields clear to auscultation bilaterally Breasts:  No dominant palpable mass, retraction, or nipple discharge Abdomen:  Soft, non tender, no hepatosplenomegaly or masses Pelvic:  External genitalia is normal in appearance.  The vagina is normal in appearance. The cervix is bulbous, no CMT.  Thin prep pap is done w/ HR HPV cotesting. Unable to  adequately assess uterus/ovaries d/t body habitus Extremities:  No swelling or varicosities noted Psych:  She has a normal mood and affect  Assessment:   Healthy well-woman exam Primary amenorrhea Obesity Hirsutism CHTN STD screen Hidradenitis suppurativa  Smoker  Plan:  Not fasting today, so will come back on Saturday for fasting lipid panel, cbc, cmp, tsh, a1c, hiv, rpr, hep b Discussed  possibility of pcos, anovulation, will check pelvic u/s, then coc's +/- metformin x then try coming off coc's to try for pregnancy- want to see better bp before putting on coc's F/U 1wk for pelvic u/s, then f/u w/ me- to take bp meds 1-2hrs before coming Gave printed info on hidradenitis/ways to improve Advised weight loss Advised smoking cessation Mammogram @31yo  or sooner if problems Colonoscopy @31yo  or sooner if problems  Marge Duncans CNM, [facility] 06/13/2016 11:48 AM

## 2016-06-13 NOTE — Patient Instructions (Addendum)
Come back Saturday for labs (open at 8am close at 12pm)  Take blood pressure medicine 1-2 hours before your next appointment  Hidradenitis . Loose, light clothing. Avoid heat, friction, shearing. Don't squeeze! . Wash clothes in perfume/dye free detergent . Gentle non-soap cleanser, wash gently w/ fingers (No cloth, loofah), can use antibacterial cleanser . Stop smoking! . Weight loss . Decrease/no dairy

## 2016-06-14 LAB — CYTOLOGY - PAP
CHLAMYDIA, DNA PROBE: NEGATIVE
Diagnosis: NEGATIVE
HPV (WINDOPATH): NOT DETECTED
Neisseria Gonorrhea: NEGATIVE

## 2016-06-17 LAB — COMPREHENSIVE METABOLIC PANEL
ALBUMIN: 4.5 g/dL (ref 3.5–5.5)
ALT: 70 IU/L — ABNORMAL HIGH (ref 0–32)
AST: 35 IU/L (ref 0–40)
Albumin/Globulin Ratio: 1.4 (ref 1.2–2.2)
Alkaline Phosphatase: 52 IU/L (ref 39–117)
BUN / CREAT RATIO: 16 (ref 9–23)
BUN: 12 mg/dL (ref 6–20)
Bilirubin Total: 0.4 mg/dL (ref 0.0–1.2)
CALCIUM: 9.3 mg/dL (ref 8.7–10.2)
CO2: 25 mmol/L (ref 18–29)
CREATININE: 0.77 mg/dL (ref 0.57–1.00)
Chloride: 99 mmol/L (ref 96–106)
GFR calc Af Amer: 119 mL/min/{1.73_m2} (ref >59)
GFR, EST NON AFRICAN AMERICAN: 103 mL/min/{1.73_m2} (ref >59)
GLOBULIN, TOTAL: 3.2 g/dL (ref 1.5–4.5)
Glucose: 121 mg/dL — ABNORMAL HIGH (ref 65–99)
Potassium: 4.5 mmol/L (ref 3.5–5.2)
SODIUM: 140 mmol/L (ref 134–144)
Total Protein: 7.7 g/dL (ref 6.0–8.5)

## 2016-06-17 LAB — LIPID PANEL
CHOLESTEROL TOTAL: 237 mg/dL — AB (ref 100–199)
Chol/HDL Ratio: 8.2 ratio units — ABNORMAL HIGH (ref 0.0–4.4)
HDL: 29 mg/dL — AB (ref >39)
LDL CALC: 175 mg/dL — AB (ref 0–99)
Triglycerides: 163 mg/dL — ABNORMAL HIGH (ref 0–149)
VLDL CHOLESTEROL CAL: 33 mg/dL (ref 5–40)

## 2016-06-17 LAB — CBC
HEMATOCRIT: 42.3 % (ref 34.0–46.6)
HEMOGLOBIN: 14.5 g/dL (ref 11.1–15.9)
MCH: 30.9 pg (ref 26.6–33.0)
MCHC: 34.3 g/dL (ref 31.5–35.7)
MCV: 90 fL (ref 79–97)
Platelets: 276 10*3/uL (ref 150–379)
RBC: 4.7 x10E6/uL (ref 3.77–5.28)
RDW: 13.2 % (ref 12.3–15.4)
WBC: 7 10*3/uL (ref 3.4–10.8)

## 2016-06-17 LAB — HEPATITIS B SURFACE ANTIGEN: Hepatitis B Surface Ag: NEGATIVE

## 2016-06-17 LAB — HEMOGLOBIN A1C
ESTIMATED AVERAGE GLUCOSE: 131 mg/dL
Hgb A1c MFr Bld: 6.2 % — ABNORMAL HIGH (ref 4.8–5.6)

## 2016-06-17 LAB — TSH: TSH: 1.02 u[IU]/mL (ref 0.450–4.500)

## 2016-06-17 LAB — HIV ANTIBODY (ROUTINE TESTING W REFLEX): HIV Screen 4th Generation wRfx: NONREACTIVE

## 2016-06-17 LAB — RPR: RPR Ser Ql: NONREACTIVE

## 2016-06-20 ENCOUNTER — Other Ambulatory Visit: Payer: Self-pay | Admitting: Women's Health

## 2016-06-20 DIAGNOSIS — N910 Primary amenorrhea: Secondary | ICD-10-CM

## 2016-06-21 ENCOUNTER — Ambulatory Visit: Payer: BLUE CROSS/BLUE SHIELD | Admitting: Adult Health

## 2016-06-21 ENCOUNTER — Ambulatory Visit (INDEPENDENT_AMBULATORY_CARE_PROVIDER_SITE_OTHER): Payer: BLUE CROSS/BLUE SHIELD

## 2016-06-21 ENCOUNTER — Ambulatory Visit (INDEPENDENT_AMBULATORY_CARE_PROVIDER_SITE_OTHER): Payer: BLUE CROSS/BLUE SHIELD | Admitting: Women's Health

## 2016-06-21 ENCOUNTER — Encounter: Payer: Self-pay | Admitting: Women's Health

## 2016-06-21 VITALS — BP 138/78 | HR 88 | Ht 63.0 in | Wt 208.0 lb

## 2016-06-21 DIAGNOSIS — R740 Nonspecific elevation of levels of transaminase and lactic acid dehydrogenase [LDH]: Secondary | ICD-10-CM

## 2016-06-21 DIAGNOSIS — N910 Primary amenorrhea: Secondary | ICD-10-CM | POA: Diagnosis not present

## 2016-06-21 DIAGNOSIS — N83292 Other ovarian cyst, left side: Secondary | ICD-10-CM | POA: Diagnosis not present

## 2016-06-21 DIAGNOSIS — E785 Hyperlipidemia, unspecified: Secondary | ICD-10-CM | POA: Insufficient documentation

## 2016-06-21 DIAGNOSIS — N854 Malposition of uterus: Secondary | ICD-10-CM | POA: Diagnosis not present

## 2016-06-21 DIAGNOSIS — R938 Abnormal findings on diagnostic imaging of other specified body structures: Secondary | ICD-10-CM

## 2016-06-21 DIAGNOSIS — R9389 Abnormal findings on diagnostic imaging of other specified body structures: Secondary | ICD-10-CM | POA: Insufficient documentation

## 2016-06-21 DIAGNOSIS — R7303 Prediabetes: Secondary | ICD-10-CM | POA: Diagnosis not present

## 2016-06-21 DIAGNOSIS — R7401 Elevation of levels of liver transaminase levels: Secondary | ICD-10-CM | POA: Insufficient documentation

## 2016-06-21 NOTE — Progress Notes (Addendum)
PELVIC US TA/TV:homogeneous anteverted uterus,wnl,heterogeneous endometrium 9.8 mm w/mult tiny cystic areas,enlarged ovaries bilat,right ovary vol 22 cm(3),left ovary vol. 11cm(3),simple left exophytic cyst 3.5 x 3.3 x 4.4 cm,no free fluid

## 2016-06-21 NOTE — Progress Notes (Addendum)
   [facility] Visit  Patient name: Dawn Ray MRN [id number]  Date of birth: 1985-06-22  CC & HPI:  Dawn Ray is a 31 y.o. G0P0000 Caucasian female presenting today to discuss today's pelvic u/s. Was done for primary amenorrhea. Suspicion for PCOS d/t amenorrhea, hirsutism, mild acne, obesity. Also has CHTN managed by PCP. We did labs last week at physical, A1C 6.2, ALT 70 (AST normal), lipid panel abnormal; pap, TSH, all STD screening normal. ETOH only on occasion, never takes apap-only ibuprofen if needed. Did join gym since our discussion last week about weight loss.  No LMP recorded. Patient is not currently having periods (Reason: Irregular Periods).  Pertinent History Reviewed:  Medical & Surgical Hx:   Past medical, surgical, family, and social history reviewed in electronic medical record Medications: Reviewed & Updated - see associated section Allergies: Reviewed in electronic medical record  Objective Findings:  Vitals: BP 138/78 (BP Location: Right Arm, Patient Position: Sitting, Cuff Size: Normal)   Pulse 88   Ht 5\' 3"  (1.6 m)   Wt 208 lb (94.3 kg)   BMI 36.85 kg/m  Body mass index is 36.85 kg/m.  Physical Examination: General appearance - alert, well appearing, and in no distress  Today's Pelvic U/S:  Dawn Ray is a [age] y.o. G0P0000 for a pelvic sonogram for amenorrhea.  Uterus                      8.5 x 4.1 x 2.9 cm, homogeneous anteverted uterus,wnl  Endometrium          9.8 mm, symmetrical, heterogeneous endometrium w/mult.tiny cystic areas  Right ovary             3.5 x 3.4 x 3.6 cm, enlarged ovary, vol 22 cm   Left ovary                2.6 x 2.8 x 3 cm, simple left exophytic cyst 3.5 x 3.3 x 4.4 cm,enlarged ovary,vol 11 cm(3)  No free fluid   Technician Comments: PELVIC US TA/TV:homogeneous anteverted uterus,wnl,heterogeneous endometrium 9.8 mm w/mult.tiny cysts,enlarged ovaries bilat,right ovary vol 22 cm(3),left ovary  vol. 11cm(3),simple left exophytic cyst 3.5 x 3.3 x 4.4 cm,no free fluid,no pain during ultrasound,ovaries appear mobile.  Amber Flora LippsJ Carl 06/21/2016 2:59 PM  Assessment & Plan:  A:   Primary amenorrhea  Possible PCOS/metabolic syndrome  Abnormal pelvic u/s  Hyperlipidemia  Pre-diabetes  Obesity  P:  Printed labs for her to take to her PCP to discuss, she states it can take awhile to get an appointment w/ him- so recommended she go ahead and start Borg[employer]nered Krill Oil 1,500mg , no red meats, decrease carbs, weight loss- recommend to recheck CMP/Lipid panel/A1C in 3-736mths  Has appt w/ GI in March- can discuss elevated ALT w/ them as well  Return in about 1 week (around 06/28/2016) for for f/u w/ [facility]. to discuss pelvic u/s results/plan (neither MD here today for me to discuss w/)  Dawn Ray, [name] CNM, WHNP-BC 06/21/2016 3:22 PM   06/25/16: discussed w/ [facility] since he will be seeing her this week, wants to keep appt as is and will discuss endometrial biopsy.

## 2016-06-21 NOTE — Patient Instructions (Addendum)
Call Dr. Margo AyeHall today and schedule appointment to discuss labs  For High Cholesterol: Red Krill Oil 1500mg  daily Weight loss . Exercise, decrease carbs, no red meats . No concentrated sugars . Mediterranean style diet:  o High in fruits, vegs, whole grains, beans, nuts, seeds, olive oil o Low-Mod fish, poultry, dairy o Little red meat

## 2016-06-28 ENCOUNTER — Encounter: Payer: Self-pay | Admitting: Obstetrics & Gynecology

## 2016-06-28 ENCOUNTER — Ambulatory Visit (INDEPENDENT_AMBULATORY_CARE_PROVIDER_SITE_OTHER): Payer: BLUE CROSS/BLUE SHIELD | Admitting: Obstetrics & Gynecology

## 2016-06-28 VITALS — BP 118/80 | HR 74 | Ht 63.0 in | Wt 203.4 lb

## 2016-06-28 DIAGNOSIS — R938 Abnormal findings on diagnostic imaging of other specified body structures: Secondary | ICD-10-CM

## 2016-06-28 DIAGNOSIS — N910 Primary amenorrhea: Secondary | ICD-10-CM

## 2016-06-28 DIAGNOSIS — R9389 Abnormal findings on diagnostic imaging of other specified body structures: Secondary | ICD-10-CM

## 2016-06-28 NOTE — Progress Notes (Signed)
Chief Complaint  Patient presents with  . discuss ultrasound    Blood pressure 118/80, pulse 74, height 5\' 3"  (1.6 m), weight 203 lb 6.4 oz (92.3 kg).  31 y.o. G0P0000 No LMP recorded. Patient is not currently having periods (Reason: Irregular Periods). The current method of family planning is none.  Outpatient Encounter Prescriptions as of 06/28/2016  Medication Sig  . losartan (COZAAR) 25 MG tablet Take 25 mg by mouth daily.  . metFORMIN (GLUCOPHAGE) 500 MG tablet Take by mouth 2 (two) times daily with a meal.  . pantoprazole (PROTONIX) 40 MG tablet Take 40 mg by mouth daily.   No facility-administered encounter medications on file as of 06/28/2016.     SubjectiveStephanie D Ray is a 31 y.o. G0P0000 Caucasian female presenting today to discuss today's pelvic u/s. Was done for primary amenorrhea. Suspicion for PCOS d/t amenorrhea, hirsutism, mild acne, obesity. Also has CHTN managed by PCP. We did labs last week at physical, A1C 6.2, ALT 70 (AST normal), lipid panel abnormal; pap, TSH, all STD screening normal. ETOH only on occasion, never takes apap-only ibuprofen if needed. Did join gym since our discussion last week about weight loss.  No LMP recorded. Patient is not currently having periods (Reason: Irregular Periods).   Objective   Pertinent ROS No burning with urination, frequency or urgency No nausea, vomiting or diarrhea Nor fever chills or other constitutional symptoms   Labs or studies GYNECOLOGIC SONOGRAM   Dawn Ray is a 31 y.o. G0P0000 for a pelvic sonogram for amenorrhea.  Uterus                      8.5 x 4.1 x 2.9 cm, homogeneous anteverted uterus,wnl  Endometrium          9.8 mm, symmetrical, heterogeneous endometrium w/mult.tiny cystic areas  Right ovary             3.5 x 3.4 x 3.6 cm, enlarged ovary, vol 22 cm   Left ovary                2.6 x 2.8 x 3 cm, simple left exophytic cyst 3.5 x 3.3 x 4.4 cm,enlarged ovary,vol 11  cm(3)  No free fluid   Technician Comments:  PELVIC US TA/TV:homogeneous anteverted uterus,wnl,heterogeneous endometrium 9.8 mm w/mult.tiny cysts,enlarged ovaries bilat,right ovary vol 22 cm(3),left ovary vol. 11cm(3),simple left exophytic cyst 3.5 x 3.3 x 4.4 cm,no free fluid,no pain during ultrasound,ovaries appear mobile.     Dawn Ray 06/21/2016 2:59 PM  Clinical Impression and recommendations:  I have reviewed the sonogram results above.  Combined with the patient's current clinical course, below are my impressions and any appropriate recommendations for management based on the sonographic findings:  1. Cystic changes in endometrium, would recommend withdrawal with progestin , provera 10 mg x 14 days. 2. Management with OCP until pregnancy actively desired would likely have metabolic benefits for her PCOS, as would the interventions planned already.(wt loss, etc.) 3 pt likely to require ovulation induction when pregancy desired.    FERGUSON,JOHN V     Impression Diagnoses this Encounter::   ICD-9-CM ICD-10-CM   1. Amenorrhea, primary 626.0 N91.0   2. Endometrial thickening on ultra sound 793.5 R93.8     Established relevant diagnosis(es):   Plan/Recommendations: Meds ordered this encounter  Medications  . metFORMIN (GLUCOPHAGE) 500 MG tablet    Sig: Take by mouth 2 (two) times daily with a meal.  Labs or Scans Ordered: No orders of the defined types were placed in this encounter.   Management:: Endometrial biopsy next week to evaluate the endometrium then proceed with thorough work up of lifelong amenorrhea  Follow up Return in about 7 days (around 07/05/2016) for endometrial biopsy, with Dr Despina HiddenEure.        Face to face time:  10 minutes  Greater than 50% of the visit time was spent in counseling and coordination of care with the patient.  The summary and outline of the counseling and care coordination is summarized in the note  above.   All questions were answered.  Past Medical History:  Diagnosis Date  . Chronic constipation   . GERD (gastroesophageal reflux disease)   . Hypertension     Past Surgical History:  Procedure Laterality Date  . APPENDECTOMY    . CHOLECYSTECTOMY N/A 10/31/2015   Procedure: LAPAROSCOPIC CHOLECYSTECTOMY;  Surgeon: Ancil LinseyJason Evan Davis, MD;  Location: [facility];  Service: General;  Laterality: N/A;    OB History    Gravida Para Term Preterm AB Living   0 0 0 0 0 0   SAB TAB Ectopic Multiple Live Births   0 0 0 0 0      No Known Allergies  Social History   Social History  . Marital status: Single    Spouse name: N/A  . Number of children: N/A  . Years of education: N/A   Occupational History  . employed    Social History Main Topics  . Smoking status: Current Every Day Smoker    Packs/day: 0.50    Years: 6.00    Types: Cigarettes  . Smokeless tobacco: Never Used     Comment: half pack daily  . Alcohol use 0.0 oz/week     Comment: occasionally  . Drug use: No  . Sexual activity: Not Currently    Birth control/ protection: None   Other Topics Concern  . None   Social History Narrative  . None    Family History  Problem Relation Age of Onset  . Heart attack Paternal Grandfather   . Cancer Paternal Grandmother   . Heart attack Maternal Grandmother   . Hypertension Maternal Grandmother   . Cancer Maternal Grandmother   . Stroke Maternal Grandfather   . Hypertension Father   . Other Brother     died in MVA  . Colon cancer Neg Hx

## 2016-07-05 ENCOUNTER — Encounter: Payer: Self-pay | Admitting: Obstetrics & Gynecology

## 2016-07-05 ENCOUNTER — Ambulatory Visit (INDEPENDENT_AMBULATORY_CARE_PROVIDER_SITE_OTHER): Payer: BLUE CROSS/BLUE SHIELD | Admitting: Obstetrics & Gynecology

## 2016-07-05 ENCOUNTER — Other Ambulatory Visit: Payer: Self-pay | Admitting: Obstetrics & Gynecology

## 2016-07-05 VITALS — BP 120/90 | HR 74 | Wt 204.0 lb

## 2016-07-05 DIAGNOSIS — N910 Primary amenorrhea: Secondary | ICD-10-CM | POA: Diagnosis not present

## 2016-07-05 DIAGNOSIS — R938 Abnormal findings on diagnostic imaging of other specified body structures: Secondary | ICD-10-CM

## 2016-07-05 DIAGNOSIS — R9389 Abnormal findings on diagnostic imaging of other specified body structures: Secondary | ICD-10-CM

## 2016-07-05 NOTE — Addendum Note (Signed)
Addended by: Federico FlakeNES, Antanisha Mohs A on: 07/05/2016 11:43 AM   Modules accepted: Orders

## 2016-07-05 NOTE — Progress Notes (Signed)
Endometrial Biopsy Procedure Note  Pre-operative Diagnosis: long standing amenorrhea with complex thickened endometrium  Post-operative Diagnosis: same  Indications: complex thickened endometrium  Procedure Details   Urine pregnancy test was not done.  The risks (including infection, bleeding, pain, and uterine perforation) and benefits of the procedure were explained to the patient and Written informed consent was obtained.  Antibiotic prophylaxis against endocarditis was not indicated.   The patient was placed in the dorsal lithotomy position.  Bimanual exam showed the uterus to be in the neutral position.  A Graves' speculum inserted in the vagina, and the cervix prepped with povidone iodine.  Endocervical curettage with a Kevorkian curette was not performed.   A sharp tenaculum was applied to the anterior lip of the cervix for stabilization.  A sterile uterine sound was used to sound the uterus to a depth of 6.5cm.  A Pipelle endometrial aspirator was used to sample the endometrium.  Sample was sent for pathologic examination.  Condition: Stable  Complications: None  Plan:  The patient was advised to call for any fever or for prolonged or severe pain or bleeding. She was advised to use OTC ibuprofen as needed for mild to moderate pain. She was advised to avoid vaginal intercourse for 48 hours or until the bleeding has completely stopped.  Attending Physician Documentation: I was present for or performed the following: endometrial biopsy  Follow up results 1 week

## 2016-07-12 ENCOUNTER — Encounter: Payer: Self-pay | Admitting: Obstetrics & Gynecology

## 2016-07-12 ENCOUNTER — Ambulatory Visit (INDEPENDENT_AMBULATORY_CARE_PROVIDER_SITE_OTHER): Payer: BLUE CROSS/BLUE SHIELD | Admitting: Obstetrics & Gynecology

## 2016-07-12 VITALS — BP 124/80 | HR 70 | Wt 206.0 lb

## 2016-07-12 DIAGNOSIS — N910 Primary amenorrhea: Secondary | ICD-10-CM | POA: Diagnosis not present

## 2016-07-12 MED ORDER — MEDROXYPROGESTERONE ACETATE 10 MG PO TABS: 10.0000 mg | ORAL_TABLET | Freq: Every day | ORAL | 0 refills | Status: DC

## 2016-07-12 NOTE — Progress Notes (Signed)
Follow up appointment for results  Chief Complaint  Patient presents with  . Results    Blood pressure 124/80, pulse 70, weight 206 lb (93.4 kg).    Endometrial biopsy: Benign, inactive endometrium, no hyperplasia or malignancy  MEDS ordered this encounter: Meds ordered this encounter  Medications  . medroxyPROGESTERone (PROVERA) 10 MG tablet    Sig: Take 1 tablet (10 mg total) by mouth daily.    Dispense:  10 tablet    Refill:  0    Orders for this encounter: Orders Placed This Encounter  Procedures  . US Pelvis Complete  . US Transvaginal Non-OB    Impression: Amenorrhea, primary - Plan: US Pelvis Complete, US Transvaginal Non-OB    Plan: Provera x 10 days then re ultrasound to evaluate the endometrial cavity and to see if she had an associated period  Follow Up: Return in about 3 weeks (around 08/02/2016) for GYN sono, Follow up, with Dr Despina HiddenEure.       Face to face time:  10 minutes  Greater than 50% of the visit time was spent in counseling and coordination of care with the patient.  The summary and outline of the counseling and care coordination is summarized in the note above.   All questions were answered.  Past Medical History:  Diagnosis Date  . Chronic constipation   . GERD (gastroesophageal reflux disease)   . Hypertension     Past Surgical History:  Procedure Laterality Date  . APPENDECTOMY    . CHOLECYSTECTOMY N/A 10/31/2015   Procedure: LAPAROSCOPIC CHOLECYSTECTOMY;  Surgeon: Ancil LinseyJason Evan Davis, MD;  Location: [facility];  Service: General;  Laterality: N/A;    OB History    Gravida Para Term Preterm AB Living   0 0 0 0 0 0   SAB TAB Ectopic Multiple Live Births   0 0 0 0 0      No Known Allergies  Social History   Social History  . Marital status: Single    Spouse name: N/A  . Number of children: N/A  . Years of education: N/A   Occupational History  . employed    Social History Main Topics  . Smoking status: Current Every Day  Smoker    Packs/day: 0.50    Years: 6.00    Types: Cigarettes  . Smokeless tobacco: Never Used     Comment: half pack daily  . Alcohol use 0.0 oz/week     Comment: occasionally  . Drug use: No  . Sexual activity: Not Currently    Birth control/ protection: None   Other Topics Concern  . None   Social History Narrative  . None    Family History  Problem Relation Age of Onset  . Heart attack Paternal Grandfather   . Cancer Paternal Grandmother   . Heart attack Maternal Grandmother   . Hypertension Maternal Grandmother   . Cancer Maternal Grandmother   . Stroke Maternal Grandfather   . Hypertension Father   . Other Brother     died in MVA  . Colon cancer Neg Hx

## 2016-07-16 ENCOUNTER — Telehealth: Payer: Self-pay | Admitting: Nurse Practitioner

## 2016-07-16 ENCOUNTER — Ambulatory Visit: Payer: BLUE CROSS/BLUE SHIELD | Admitting: Nurse Practitioner

## 2016-07-16 ENCOUNTER — Encounter: Payer: Self-pay | Admitting: Gastroenterology

## 2016-07-16 ENCOUNTER — Encounter: Payer: Self-pay | Admitting: Nurse Practitioner

## 2016-07-16 NOTE — Telephone Encounter (Signed)
PATIENT WAS A NO SHOW AND LETTER SENT  °

## 2016-07-17 NOTE — Telephone Encounter (Signed)
Noted  

## 2016-07-25 ENCOUNTER — Telehealth: Payer: Self-pay | Admitting: Obstetrics & Gynecology

## 2016-07-25 ENCOUNTER — Encounter: Payer: Self-pay | Admitting: *Deleted

## 2016-07-25 ENCOUNTER — Other Ambulatory Visit: Payer: Self-pay | Admitting: Obstetrics & Gynecology

## 2016-07-25 MED ORDER — KETOROLAC TROMETHAMINE 10 MG PO TABS: 10.0000 mg | ORAL_TABLET | Freq: Three times a day (TID) | ORAL | 0 refills | Status: DC | PRN

## 2016-07-25 NOTE — Telephone Encounter (Signed)
Patient called stating she has been having a lot of cramping and bleeding. She left work in tears yesterday d/t the pain. She would like a note for work. If she is out for 3 days, she only gets 1 point towards her. Please advise.

## 2016-07-31 ENCOUNTER — Other Ambulatory Visit: Payer: BLUE CROSS/BLUE SHIELD

## 2016-07-31 ENCOUNTER — Ambulatory Visit: Payer: BLUE CROSS/BLUE SHIELD | Admitting: Obstetrics & Gynecology

## 2016-08-07 ENCOUNTER — Other Ambulatory Visit: Payer: BLUE CROSS/BLUE SHIELD

## 2016-08-07 ENCOUNTER — Ambulatory Visit: Payer: BLUE CROSS/BLUE SHIELD | Admitting: Obstetrics & Gynecology

## 2016-12-20 ENCOUNTER — Ambulatory Visit (INDEPENDENT_AMBULATORY_CARE_PROVIDER_SITE_OTHER): Payer: BLUE CROSS/BLUE SHIELD | Admitting: Nurse Practitioner

## 2016-12-20 ENCOUNTER — Encounter: Payer: Self-pay | Admitting: Nurse Practitioner

## 2016-12-20 VITALS — BP 140/88 | HR 88 | Temp 97.0°F | Ht 63.0 in | Wt 191.8 lb

## 2016-12-20 DIAGNOSIS — K219 Gastro-esophageal reflux disease without esophagitis: Secondary | ICD-10-CM

## 2016-12-20 DIAGNOSIS — K5900 Constipation, unspecified: Secondary | ICD-10-CM | POA: Diagnosis not present

## 2016-12-20 NOTE — Progress Notes (Signed)
Referring Provider: Benita Stabile, MD Primary Care Physician:  Benita Stabile, MD Primary GI:  Dr. Darrick Penna  Chief Complaint  Patient presents with  . Gastroesophageal Reflux    Protonix not helping; burns in throat at night  . Abdominal Pain    luq  . Constipation    HPI:   Dawn Ray is a 31 y.o. female who presents for GERD and bowel habit changes. The patient was last seen in our office on 04/16/2016 for right upper quadrant pain, habit changes, GERD, nausea without vomiting. The patient did have a laparoscopic cholecystectomy on 10/31/2015. EGD was initially scheduled but then canceled. At her last visit she stated she was doing well overall and no abdominal pain since surgery but has had increased diarrhea and bowel irregularity. Recurrent reflux as well. She is on Protonix daily and Pepcid in the evening, no other GI symptoms. She was started on Exelon 60 mg daily and recommended for 3 month follow-up. She missed her follow-up visit.  Today she states she's ok overall. She is still having reflux about 2-3 times a week. Is on Protonix. Thinks she tried Dexilant but can't remember if it worked any better. Cannot name specific triggers. Mostly at night when she's asleep. Minimal occasional LUQ pain, thinks her stomach feels "hard" and some bloating as well. Denies hematochezia, melena, fever, chills, N/V, unintentional weight loss. She is trying to lose weight via diet and exercise and has lost about 17 lbs in the past 6 months. She is also having some constipation with a bowel movement every 2-3 days and then when she does go she has multiple bowel movements and has straining. Denies chest pain, dyspnea, dizziness, lightheadedness, syncope, near syncope. Denies any other upper or lower GI symptoms.  Past Medical History:  Diagnosis Date  . Chronic constipation   . GERD (gastroesophageal reflux disease)   . Hypertension     Past Surgical History:  Procedure Laterality Date  .  APPENDECTOMY    . CHOLECYSTECTOMY N/A 10/31/2015   Procedure: LAPAROSCOPIC CHOLECYSTECTOMY;  Surgeon: Ancil Linsey, MD;  Location: [facility];  Service: General;  Laterality: N/A;    Current Outpatient Prescriptions  Medication Sig Dispense Refill  . losartan (COZAAR) 25 MG tablet Take 25 mg by mouth daily.    . pantoprazole (PROTONIX) 40 MG tablet Take 40 mg by mouth daily.     No current facility-administered medications for this visit.     Allergies as of 12/20/2016  . (No Known Allergies)    Family History  Problem Relation Age of Onset  . Heart attack Paternal Grandfather   . Stomach cancer Paternal Grandmother   . Heart attack Maternal Grandmother   . Hypertension Maternal Grandmother   . Cancer Maternal Grandmother   . Stroke Maternal Grandfather   . Hypertension Father   . Other Brother        died in MVA  . Colon cancer Neg Hx     Social History   Social History  . Marital status: Single    Spouse name: N/A  . Number of children: N/A  . Years of education: N/A   Occupational History  . employed    Social History Main Topics  . Smoking status: Current Every Day Smoker    Packs/day: 0.50    Years: 6.00    Types: Cigarettes  . Smokeless tobacco: Never Used     Comment: half pack daily  . Alcohol use 0.0 oz/week  Comment: occasionally  . Drug use: No  . Sexual activity: Not Currently    Birth control/ protection: None   Other Topics Concern  . None   Social History Narrative  . None    Review of Systems: Complete ROS negative except as per HPI.   Physical Exam: BP 140/88   Pulse 88   Temp (!) 97 F (36.1 C) (Oral)   Ht 5\' 3"  (1.6 m)   Wt 191 lb 12.8 oz (87 kg)   BMI 33.98 kg/m  General:   Obese female. Alert and oriented. Pleasant and cooperative. Well-nourished and well-developed.  Eyes:  Without icterus, sclera clear and conjunctiva pink.  Ears:  Normal auditory acuity. Cardiovascular:  S1, S2 present without murmurs appreciated.  Extremities without clubbing or edema. Respiratory:  Clear to auscultation bilaterally. No wheezes, rales, or rhonchi. No distress.  Gastrointestinal:  +BS, rounded but soft, and non-distended. Mild LUQ soreness to palpation. No HSM noted. No guarding or rebound. No masses appreciated.  Rectal:  Deferred  Musculoskalatal:  Symmetrical without gross deformities.  Neurologic:  Alert and oriented x4;  grossly normal neurologically. Psych:  Alert and cooperative. Normal mood and affect. Heme/Lymph/Immune: No excessive bruising noted.    12/20/2016 11:09 AM   Disclaimer: This note was dictated with voice recognition software. Similar sounding words can inadvertently be transcribed and may not be corrected upon review.

## 2016-12-20 NOTE — Assessment & Plan Note (Addendum)
At this point I'll have her stop Protonix. I will give her samples of Dexon 60 mg once a day to last 2 weeks. I have requested a progress report in 1-2 weeks. If this works better we can send in a prescription. If not, we can try some other option such as Prevacid or AcipHex. She has made a concerted effort to lose weight and has lost 17 pounds in about the past 6 months through diet and exercise. I've congratulated her on her efforts and indicated this will only help her overall reflux symptoms. Return for follow-up in 2 months. No alarm features currently.

## 2016-12-20 NOTE — Assessment & Plan Note (Signed)
Typically has a bowel movement every 2-3 days and then has straining and multiple bowel movements a day. She is essentially having mild constipation with bowel irregularity. I will have her start Colace once a day. She can use MiraLAX as needed 1-2 times a day for no bowel movement in 2 days. I recommended she continue to drink plenty of water. Return for follow-up in 2 months.

## 2016-12-20 NOTE — Patient Instructions (Signed)
1. Again, great job on the weight loss! Keep up the good work! 2. Stop taking Protonix for now. 3. I am giving the samples of Dexalone 60 mg pills. Take this once a day, on an empty stomach. The samples last year for 2 weeks. 4. In 1-2 weeks, call our office and let us know if the medication is working better for you. 5. Return for follow-up in 2 months. 6. Call if you have any questions or concerns.

## 2016-12-20 NOTE — Progress Notes (Signed)
cc'd to pcp 

## 2017-01-01 ENCOUNTER — Other Ambulatory Visit: Payer: Self-pay | Admitting: Occupational Medicine

## 2017-01-01 ENCOUNTER — Ambulatory Visit: Payer: Self-pay

## 2017-01-01 DIAGNOSIS — M79645 Pain in left finger(s): Secondary | ICD-10-CM

## 2017-02-13 ENCOUNTER — Ambulatory Visit: Payer: BLUE CROSS/BLUE SHIELD | Admitting: Nurse Practitioner

## 2017-02-13 ENCOUNTER — Telehealth: Payer: Self-pay | Admitting: Nurse Practitioner

## 2017-02-13 ENCOUNTER — Encounter: Payer: Self-pay | Admitting: Nurse Practitioner

## 2017-02-13 NOTE — Progress Notes (Deleted)
Referring Provider: Benita Stabile, MD Primary Care Physician:  Benita Stabile, MD Primary GI:  Dr. Darrick Penna  No chief complaint on file.   HPI:   Dawn Ray is a 31 y.o. female who presents for follow-up on GERD and constipation. The patient was last seen in our office 12/20/2016 for the same. She was having persistent reflux despite Protonix. In terms worsen night. Also with left upper quadrant pain and bloating. She was congratulated for her intentional weight loss of 17 pounds in 6 months. Also with constipation and a bowel movement every 2-3 days requiring straining. She was trialed on Dexilant samples, requested progress report in 1-2 weeks, start Colace once a day, MiraLAX one to 2 times a day as needed for no bowel movement in 2 days.  No progress report was received.  Today she states   Past Medical History:  Diagnosis Date  . Chronic constipation   . GERD (gastroesophageal reflux disease)   . Hypertension     Past Surgical History:  Procedure Laterality Date  . APPENDECTOMY    . CHOLECYSTECTOMY N/A 10/31/2015   Procedure: LAPAROSCOPIC CHOLECYSTECTOMY;  Surgeon: Ancil Linsey, MD;  Location: [facility];  Service: General;  Laterality: N/A;    Current Outpatient Prescriptions  Medication Sig Dispense Refill  . losartan (COZAAR) 25 MG tablet Take 25 mg by mouth daily.    . pantoprazole (PROTONIX) 40 MG tablet Take 40 mg by mouth daily.     No current facility-administered medications for this visit.     Allergies as of 02/13/2017  . (No Known Allergies)    Family History  Problem Relation Age of Onset  . Heart attack Paternal Grandfather   . Stomach cancer Paternal Grandmother   . Heart attack Maternal Grandmother   . Hypertension Maternal Grandmother   . Cancer Maternal Grandmother   . Stroke Maternal Grandfather   . Hypertension Father   . Other Brother        died in MVA  . Colon cancer Neg Hx     Social History   Social History  . Marital  status: Single    Spouse name: N/A  . Number of children: N/A  . Years of education: N/A   Occupational History  . employed    Social History Main Topics  . Smoking status: Current Every Day Smoker    Packs/day: 0.50    Years: 6.00    Types: Cigarettes  . Smokeless tobacco: Never Used     Comment: half pack daily  . Alcohol use 0.0 oz/week     Comment: occasionally  . Drug use: No  . Sexual activity: Not Currently    Birth control/ protection: None   Other Topics Concern  . Not on file   Social History Narrative  . No narrative on file    Review of Systems: General: Negative for anorexia, weight loss, fever, chills, fatigue, weakness. Eyes: Negative for vision changes.  ENT: Negative for hoarseness, difficulty swallowing , nasal congestion. CV: Negative for chest pain, angina, palpitations, dyspnea on exertion, peripheral edema.  Respiratory: Negative for dyspnea at rest, dyspnea on exertion, cough, sputum, wheezing.  GI: See history of present illness. GU:  Negative for dysuria, hematuria, urinary incontinence, urinary frequency, nocturnal urination.  MS: Negative for joint pain, low back pain.  Derm: Negative for rash or itching.  Neuro: Negative for weakness, abnormal sensation, seizure, frequent headaches, memory loss, confusion.  Psych: Negative for anxiety, depression, suicidal ideation, hallucinations.  Endo: Negative for unusual weight change.  Heme: Negative for bruising or bleeding. Allergy: Negative for rash or hives.   Physical Exam: There were no vitals taken for this visit. General:   Alert and oriented. Pleasant and cooperative. Well-nourished and well-developed.  Head:  Normocephalic and atraumatic. Eyes:  Without icterus, sclera clear and conjunctiva pink.  Ears:  Normal auditory acuity. Mouth:  No deformity or lesions, oral mucosa pink.  Throat/Neck:  Supple, without mass or thyromegaly. Cardiovascular:  S1, S2 present without murmurs  appreciated. Normal pulses noted. Extremities without clubbing or edema. Respiratory:  Clear to auscultation bilaterally. No wheezes, rales, or rhonchi. No distress.  Gastrointestinal:  +BS, soft, non-tender and non-distended. No HSM noted. No guarding or rebound. No masses appreciated.  Rectal:  Deferred  Musculoskalatal:  Symmetrical without gross deformities. Normal posture. Skin:  Intact without significant lesions or rashes. Neurologic:  Alert and oriented x4;  grossly normal neurologically. Psych:  Alert and cooperative. Normal mood and affect. Heme/Lymph/Immune: No significant cervical adenopathy. No excessive bruising noted.    02/13/2017 12:16 PM   Disclaimer: This note was dictated with voice recognition software. Similar sounding words can inadvertently be transcribed and may not be corrected upon review.

## 2017-02-13 NOTE — Telephone Encounter (Signed)
Patient was a no show and letter sent  °

## 2017-02-13 NOTE — Telephone Encounter (Signed)
noted 

## 2017-03-15 ENCOUNTER — Encounter (HOSPITAL_COMMUNITY): Payer: Self-pay | Admitting: Emergency Medicine

## 2017-03-15 ENCOUNTER — Emergency Department (HOSPITAL_COMMUNITY)
Admission: EM | Admit: 2017-03-15 | Discharge: 2017-03-15 | Disposition: A | Discharge status: Home / Self Care | Payer: BLUE CROSS/BLUE SHIELD | Attending: Emergency Medicine | Admitting: Emergency Medicine

## 2017-03-15 DIAGNOSIS — X503XXA Overexertion from repetitive movements, initial encounter: Secondary | ICD-10-CM | POA: Insufficient documentation

## 2017-03-15 DIAGNOSIS — F17210 Nicotine dependence, cigarettes, uncomplicated: Secondary | ICD-10-CM | POA: Diagnosis not present

## 2017-03-15 DIAGNOSIS — I10 Essential (primary) hypertension: Secondary | ICD-10-CM | POA: Insufficient documentation

## 2017-03-15 DIAGNOSIS — M25531 Pain in right wrist: Secondary | ICD-10-CM | POA: Diagnosis not present

## 2017-03-15 DIAGNOSIS — Z79899 Other long term (current) drug therapy: Secondary | ICD-10-CM | POA: Diagnosis not present

## 2017-03-15 MED ORDER — NAPROXEN 500 MG PO TABS
500.0000 mg | ORAL_TABLET | Freq: Two times a day (BID) | ORAL | 0 refills | Status: DC
Start: 2017-03-15 — End: 2019-08-18

## 2017-03-15 NOTE — ED Triage Notes (Signed)
Pt c/o right wrist/hand pain x 1 week.

## 2017-03-15 NOTE — ED Provider Notes (Signed)
[facility] Provider Note   CSN: [id number] Arrival date & time: 03/15/17  0736     History   Chief Complaint Chief Complaint  Patient presents with  . Hand Pain    HPI Dawn Ray is a 31 y.o. female.  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  She does endorse intermittent swelling in this area but has had no fevers or chills or redness.  This morning it seems to be better.  There is no numbness or tingling at this time though she does occasionally have some numbness in that area.  She also has pain in the volar aspect of her wrist that seems to get worse when she ranges her wrist.  Past Medical History:  Diagnosis Date  . Chronic constipation   . GERD (gastroesophageal reflux disease)   . Hypertension     Patient Active Problem List   Diagnosis Date Noted  . Abnormal pelvic ultrasound 06/21/2016  . Hyperlipidemia 06/21/2016  . Elevated ALT measurement 06/21/2016  . Pre-diabetes 06/21/2016  . Chronic hypertension 06/13/2016  . Primary amenorrhea 06/13/2016  . Obesity 06/13/2016  . Hidradenitis suppurativa 06/13/2016  . Smoker 06/13/2016  . Bowel habit changes 04/16/2016  . GERD (gastroesophageal reflux disease) 04/16/2016  . RUQ pain 10/20/2015  . Nausea without vomiting 10/20/2015  . Constipation 09/08/2015    Past Surgical History:  Procedure Laterality Date  . APPENDECTOMY      OB History    Gravida Para Term Preterm AB Living   0 0 0 0 0 0   SAB TAB Ectopic Multiple Live Births   0 0 0 0 0       Home  Medications    Prior to Admission medications   Medication Sig Start Date End Date Taking? Authorizing Provider  losartan (COZAAR) 25 MG tablet Take 25 mg by mouth daily.    [provider]  naproxen (NAPROSYN) 500 MG tablet Take 1 tablet (500 mg total) 2 (two) times daily with a meal by mouth. 03/15/17   Eber HongMiller, Niyla Marone, MD  pantoprazole (PROTONIX) 40 MG tablet Take 40 mg by mouth daily.    [provider]    Family History Family History  Problem Relation Age of Onset  . Heart attack Paternal Grandfather   . Stomach cancer Paternal Grandmother   . Heart attack Maternal Grandmother   . Hypertension Maternal Grandmother   . Cancer Maternal Grandmother   . Stroke Maternal Grandfather   . Hypertension Father   . Other Brother        died in MVA  . Colon cancer Neg Hx     Social History Social History   Tobacco Use  . Smoking status: Current Every Day Smoker    Packs/day: 0.50    Years: 6.00    Pack years: 3.00    Types: Cigarettes  . Smokeless tobacco: Never Used  . Tobacco comment: half pack daily  Substance Use Topics  . Alcohol use: Yes    Alcohol/week: 0.0 oz    Comment: occasionally  .  Drug use: No     Allergies   Patient has no known allergies.   Review of Systems Review of Systems  Constitutional: Negative for fever.  Musculoskeletal: Positive for arthralgias and joint swelling.  Neurological: Positive for numbness. Negative for weakness.  Hematological: Does not bruise/bleed easily.     Physical Exam Updated Vital Signs BP (!) 145/102   Pulse 75   Temp 98.8 F (37.1 C)   Resp 18   Ht 5\' 3"  (1.6 m)   Wt 86.2 kg (190 lb)   SpO2 100%   BMI 33.66 kg/m   Physical Exam  Constitutional: She appears well-developed and well-nourished.  HENT:  Head: Normocephalic and atraumatic.  Eyes: Conjunctivae are normal. Right eye exhibits no discharge. Left eye exhibits no discharge.  Pulmonary/Chest: Effort normal. No respiratory distress.    Musculoskeletal:  There is normal range of motion of the right and left wrists, there is no objective swelling of the right wrist, she has a very supple right wrist joint, she is able to open and close her hand in a fist without difficulty, she has no Finkelstein's findings, she is able to range all of her fingers to extension and flexion without tenderness, there is no redness, no swelling, there is tenderness to palpation over the volar right wrist  Neurological: She is alert. Coordination normal.  There is no numbness of any part of the right hand  Skin: Skin is warm and dry. No rash noted. She is not diaphoretic. No erythema.  Psychiatric: She has a normal mood and affect.  Nursing note and vitals reviewed.    ED Treatments / Results  Labs (all labs ordered are listed, but only abnormal results are displayed) Labs Reviewed - No data to display   Radiology No results found.  Procedures Procedures (including critical care time)  Medications Ordered in ED Medications - No data to display   Initial Impression / Assessment and Plan / ED Course  I have reviewed the triage vital signs and the nursing notes.  Pertinent labs & imaging results that were available during my care of the patient were reviewed by me and considered in my medical decision making (see chart for details).     Consistent with musculoskeletal type pain, she does not have classic carpal tunnel however she would benefit from orthopedic follow-up.  Will place on high-dose NSAIDs rather than the over-the-counter NSAIDs which she has been taking and immobilized with a volar wrist brace.  Patient is agreeable  Splinted by nursing velcro splint reevlauted after splint - nv intact  Final Clinical Impressions(s) / ED Diagnoses   Final diagnoses:  Right wrist pain    ED Discharge Orders        Ordered    naproxen (NAPROSYN) 500 MG tablet  2 times daily with meals     03/15/17 [id number]       Eber HongMiller, Kiyon Fidalgo,  MD 03/15/17 [phone]

## 2017-03-15 NOTE — Discharge Instructions (Signed)
Naprosyn twice daily for 10 days See the orthopedic surgeon for further evaluation if no better in 2 weeks.

## 2017-04-09 ENCOUNTER — Encounter: Payer: Self-pay | Admitting: Orthopaedic Surgery

## 2017-04-09 ENCOUNTER — Ambulatory Visit: Payer: BLUE CROSS/BLUE SHIELD | Admitting: Orthopaedic Surgery

## 2017-04-09 VITALS — BP 138/94 | HR 86 | Ht 63.0 in | Wt 192.0 lb

## 2017-04-09 DIAGNOSIS — G5601 Carpal tunnel syndrome, right upper limb: Secondary | ICD-10-CM | POA: Diagnosis not present

## 2017-04-09 DIAGNOSIS — F17210 Nicotine dependence, cigarettes, uncomplicated: Secondary | ICD-10-CM | POA: Diagnosis not present

## 2017-04-09 NOTE — Addendum Note (Signed)
Addended by: Gevena CottonSACKFIELD, Makaley Storts L on: 04/09/2017 09:19 AM   Modules accepted: Orders

## 2017-04-09 NOTE — Progress Notes (Signed)
Subjective:    Patient ID: Dawn Ray, female    DOB: 1986/05/01, 31 y.o.   MRN: [id number]  HPI She has numbness of the right hand more at night and affecting the median nerve distribution.  She went to the ER 03-15-17 for this.  They gave her a splint and began naprosyn.  It helped some but she still has the nocturnal pain.  She wakens up and shakes her hand.  She has tried ice, heat with no help.  She has no trauma. She has no swelling, no redness.  She smokes and has smoked a half a pack daily for over five years.   Review of Systems  Cardiovascular: Negative for chest pain and leg swelling.  Musculoskeletal: Positive for arthralgias.  All other systems reviewed and are negative.  Past Medical History:  Diagnosis Date  . Chronic constipation   . GERD (gastroesophageal reflux disease)   . Hypertension     Past Surgical History:  Procedure Laterality Date  . APPENDECTOMY    . CHOLECYSTECTOMY N/A 10/31/2015   Procedure: LAPAROSCOPIC CHOLECYSTECTOMY;  Surgeon: Ancil LinseyJason Evan Davis, MD;  Location: [facility];  Service: General;  Laterality: N/A;    Current Outpatient Medications on File Prior to Visit  Medication Sig Dispense Refill  . losartan (COZAAR) 25 MG tablet Take 25 mg by mouth daily.    . pantoprazole (PROTONIX) 40 MG tablet Take 40 mg by mouth daily.    . naproxen (NAPROSYN) 500 MG tablet Take 1 tablet (500 mg total) 2 (two) times daily with a meal by mouth. (Patient not taking: Reported on 04/09/2017) 30 tablet 0   No current facility-administered medications on file prior to visit.     Social History   Socioeconomic History  . Marital status: Single    Spouse name: Not on file  . Number of children: Not on file  . Years of education: Not on file  . Highest education level: Not on file  Social Needs  . Financial resource strain: Not on file  . Food insecurity - worry: Not on file  . Food insecurity - inability: Not on file  . Transportation needs - medical:  Not on file  . Transportation needs - non-medical: Not on file  Occupational History  . Occupation: employed  Tobacco Use  . Smoking status: Current Every Day Smoker    Packs/day: 0.50    Years: 6.00    Pack years: 3.00    Types: Cigarettes  . Smokeless tobacco: Never Used  . Tobacco comment: half pack daily  Substance and Sexual Activity  . Alcohol use: Yes    Alcohol/week: 0.0 oz    Comment: occasionally  . Drug use: No  . Sexual activity: Not Currently    Birth control/protection: None  Other Topics Concern  . Not on file  Social History Narrative  . Not on file    Family History  Problem Relation Age of Onset  . Heart attack Paternal Grandfather   . Stomach cancer Paternal Grandmother   . Heart attack Maternal Grandmother   . Hypertension Maternal Grandmother   . Cancer Maternal Grandmother   . Heart failure Maternal Grandmother   . Stroke Maternal Grandfather   . Hypertension Father   . Other Brother        died in MVA  . Colon cancer Neg Hx     BP (!) 138/94   Pulse 86   Ht 5\' 3"  (1.6 m)   Wt 192 lb (87.1  kg)   BMI 34.01 kg/m      Objective:   Physical Exam  Constitutional: She is oriented to person, place, and time. She appears well-developed and well-nourished.  HENT:  Head: Normocephalic and atraumatic.  Eyes: Conjunctivae and EOM are normal. Pupils are equal, round, and reactive to light.  Neck: Normal range of motion. Neck supple.  Cardiovascular: Normal rate, regular rhythm and intact distal pulses.  Pulmonary/Chest: Effort normal.  Abdominal: Soft.  Musculoskeletal: She exhibits tenderness (She has positive Phalen and Tinel on the right wrist volar side, no swelling, no redness, ROM is full.).  Neurological: She is alert and oriented to person, place, and time. She displays normal reflexes. No cranial nerve deficit. She exhibits normal muscle tone. Coordination normal.  Skin: Skin is warm and dry.  Psychiatric: She has a normal mood and  affect. Her behavior is normal. Judgment and thought content normal.  Vitals reviewed.         Assessment & Plan:   Encounter Diagnoses  Name Primary?  . Carpal tunnel syndrome of right wrist Yes  . Cigarette nicotine dependence without complication    I have scheduled EMGs for her to be done.  I have explained the need for the test.  The patient was counseled about smoking and smoking cessation.  I spent about three to five minutes in doing this.  I, of course, determined the patient does smoke and frequency.  I have advised the patient of problems medically that can occur with continued smoking including poor wound and bone healing as well as the obvious respiratory problems.  I have assessed the patient's willingness to cut back and quit smoking.  The patient has stated she is willing to try.  I have told the patient to discuss with their family doctor also about smoking cessation.  I am willing to assist my patient in arranging this and to get additional help.I have suggested the patient have a set date to try to begin cutting back.  I have printed out a smoking cessation form about how to begin cutting back and suggestions.  I will discuss this further when I see the patient in the future.  I have stressed that although this will be very difficult to accomplish, the benefits are very substantial and will give long term health benefits from now on.  Call if any problem.  Precautions discussed.   Electronically Signed Darreld McleanWayne Jodean Valade, MD 12/4/20188:56 AM

## 2017-04-09 NOTE — Patient Instructions (Signed)
Coping with Quitting Smoking Quitting smoking is a physical and mental challenge. You will face cravings, withdrawal symptoms, and temptation. Before quitting, work with your health care provider to make a plan that can help you cope. Preparation can help you quit and keep you from giving in. How can I cope with cravings? Cravings usually last for 5-10 minutes. If you get through it, the craving will pass. Consider taking the following actions to help you cope with cravings:  Keep your mouth busy: ? Chew sugar-free gum. ? Suck on hard candies or a straw. ? Brush your teeth.  Keep your hands and body busy: ? Immediately change to a different activity when you feel a craving. ? Squeeze or play with a ball. ? Do an activity or a hobby, like making bead jewelry, practicing needlepoint, or working with wood. ? Mix up your normal routine. ? Take a short exercise break. Go for a quick walk or run up and down stairs. ? Spend time in public places where smoking is not allowed.  Focus on doing something kind or helpful for someone else.  Call a friend or family member to talk during a craving.  Join a support group.  Call a quit line, such as 1-800-QUIT-NOW.  Talk with your health care provider about medicines that might help you cope with cravings and make quitting easier for you.  How can I deal with withdrawal symptoms? Your body may experience negative effects as it tries to get used to not having nicotine in the system. These effects are called withdrawal symptoms. They may include:  Feeling hungrier than normal.  Trouble concentrating.  Irritability.  Trouble sleeping.  Feeling depressed.  Restlessness and agitation.  Craving a cigarette.  To manage withdrawal symptoms:  Avoid places, people, and activities that trigger your cravings.  Remember why you want to quit.  Get plenty of sleep.  Avoid coffee and other caffeinated drinks. These may worsen some of your  symptoms.  How can I handle social situations? Social situations can be difficult when you are quitting smoking, especially in the first few weeks. To manage this, you can:  Avoid parties, bars, and other social situations where people might be smoking.  Avoid alcohol.  Leave right away if you have the urge to smoke.  Explain to your family and friends that you are quitting smoking. Ask for understanding and support.  Plan activities with friends or family where smoking is not an option.  What are some ways I can cope with stress? Wanting to smoke may cause stress, and stress can make you want to smoke. Find ways to manage your stress. Relaxation techniques can help. For example:  Breathe slowly and deeply, in through your nose and out through your mouth.  Listen to soothing, relaxing music.  Talk with a family member or friend about your stress.  Light a candle.  Soak in a bath or take a shower.  Think about a peaceful place.  What are some ways I can prevent weight gain? Be aware that many people gain weight after they quit smoking. However, not everyone does. To keep from gaining weight, have a plan in place before you quit and stick to the plan after you quit. Your plan should include:  Having healthy snacks. When you have a craving, it may help to: ? Eat plain popcorn, crunchy carrots, celery, or other cut vegetables. ? Chew sugar-free gum.  Changing how you eat: ? Eat small portion sizes at meals. ?   Eat 4-6 small meals throughout the day instead of 1-2 large meals a day. ? Be mindful when you eat. Do not watch television or do other things that might distract you as you eat.  Exercising regularly: ? Make time to exercise each day. If you do not have time for a long workout, do short bouts of exercise for 5-10 minutes several times a day. ? Do some form of strengthening exercise, like weight lifting, and some form of aerobic exercise, like running or  swimming.  Drinking plenty of water or other low-calorie or no-calorie drinks. Drink 6-8 glasses of water daily, or as much as instructed by your health care provider.  Summary  Quitting smoking is a physical and mental challenge. You will face cravings, withdrawal symptoms, and temptation to smoke again. Preparation can help you as you go through these challenges.  You can cope with cravings by keeping your mouth busy (such as by chewing gum), keeping your body and hands busy, and making calls to family, friends, or a helpline for people who want to quit smoking.  You can cope with withdrawal symptoms by avoiding places where people smoke, avoiding drinks with caffeine, and getting plenty of rest.  Ask your health care provider about the different ways to prevent weight gain, avoid stress, and handle social situations. This information is not intended to replace advice given to you by your health care provider. Make sure you discuss any questions you have with your health care provider. Document Released: 04/20/2016 Document Revised: 04/20/2016 Document Reviewed: 04/20/2016 Elsevier Interactive Patient Education  2018 [employer]  

## 2017-05-13 ENCOUNTER — Ambulatory Visit: Payer: Self-pay | Admitting: Orthopedic Surgery

## 2017-05-23 IMAGING — CT CT ABD-PELV W/ CM
2 of 4 series · 16 of 46 positions shown, 18 images · IV contrast (iopamidol)
Comparison: 03/03/2013

CLINICAL DATA: Right upper quadrant pain

EXAM:
CT ABDOMEN AND PELVIS WITH CONTRAST
TECHNIQUE: Multidetector CT imaging of the abdomen and pelvis was performed
using the standard protocol following bolus administration of
intravenous contrast.
CONTRAST:  100mL PLANJ8-X22 IOPAMIDOL (PLANJ8-X22) INJECTION 61%

[Series 2: routine abd pel with · axial · 0.83mm/px · z∈[-816,-356]mm · 13 of 101 slices shown, 15 images]
[im 5/101  soft-tissue]
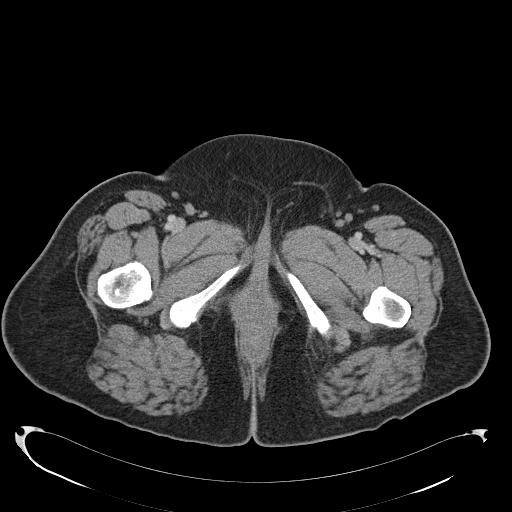
[im 5/101  bone]
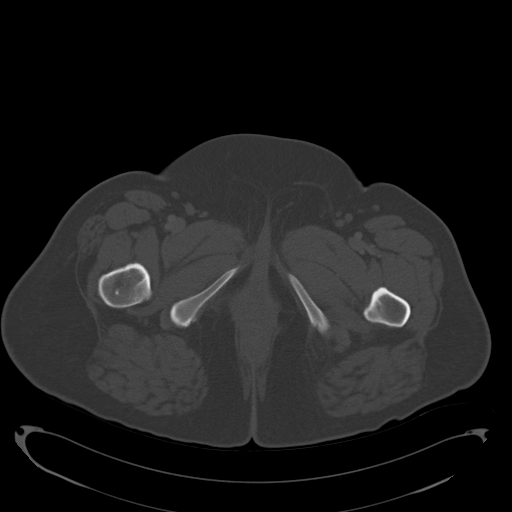
[im 13/101  soft-tissue]
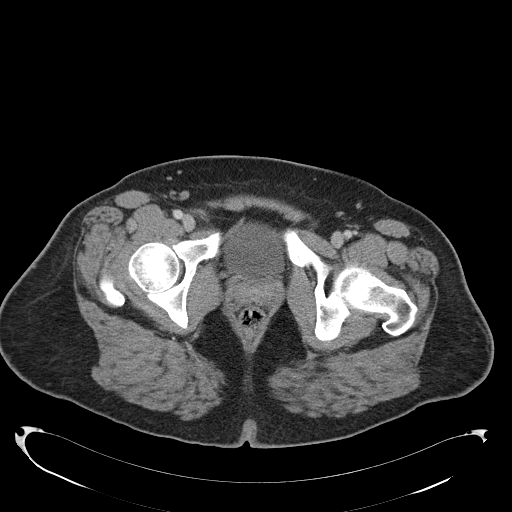
[im 21/101  soft-tissue]
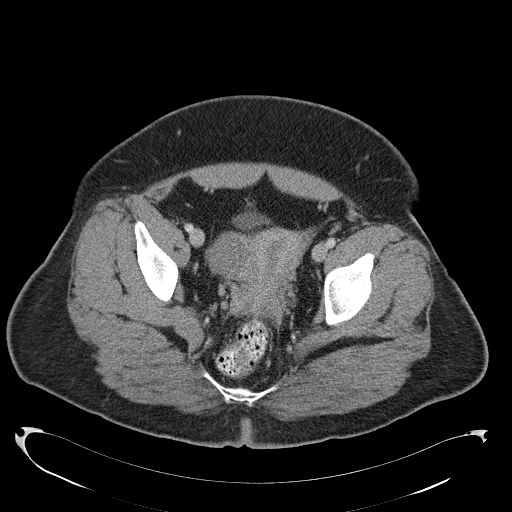
[im 29/101  soft-tissue]
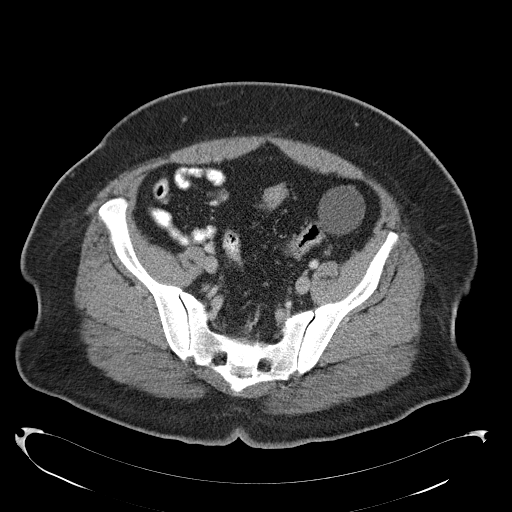
[im 37/101  soft-tissue]
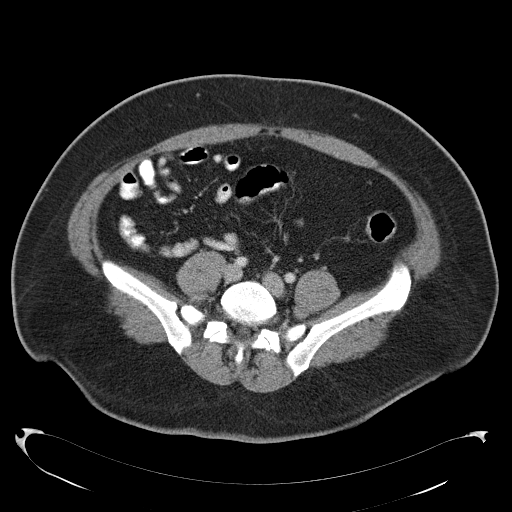
[im 45/101  soft-tissue]
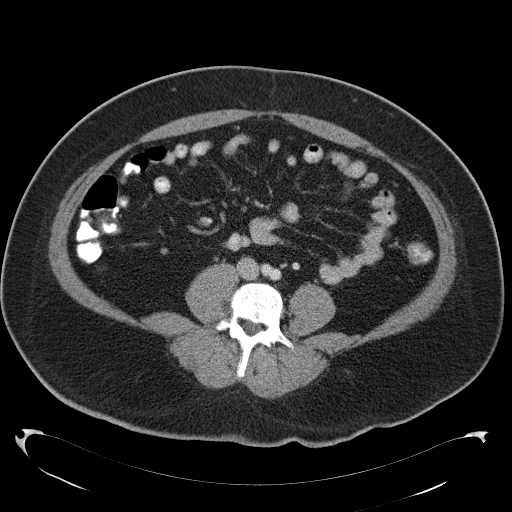
[im 53/101  soft-tissue]
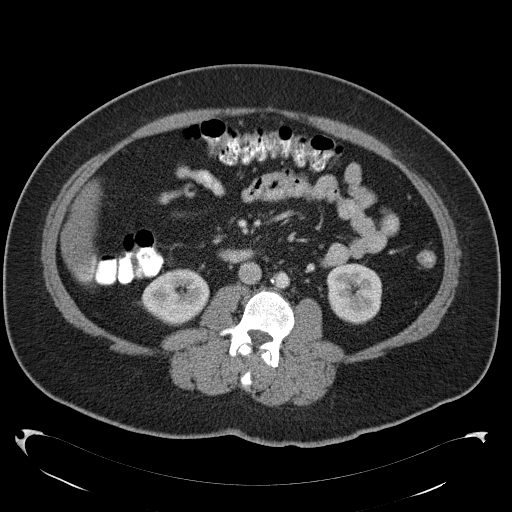
[im 57/101  soft-tissue]
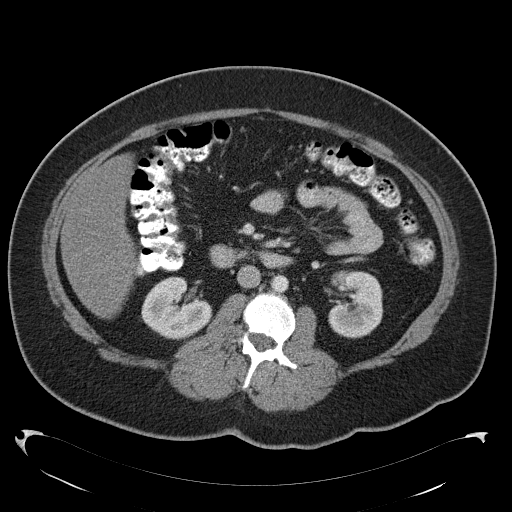
[im 65/101  soft-tissue]
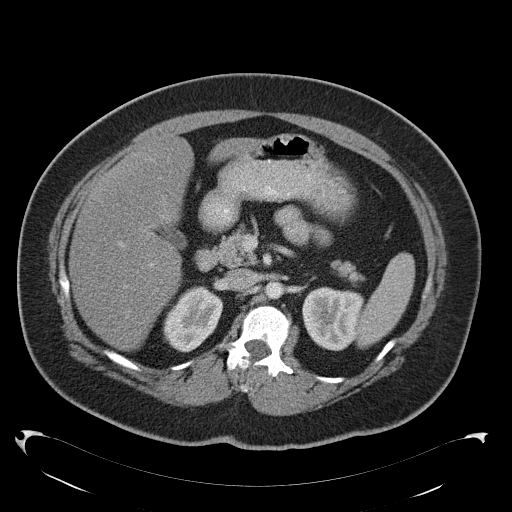
[im 65/101  bone]
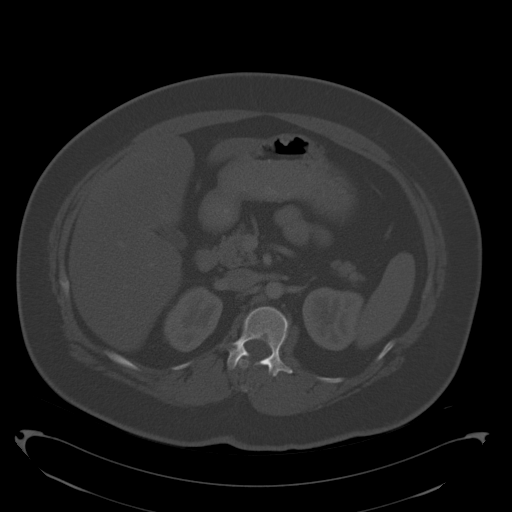
[im 73/101  soft-tissue]
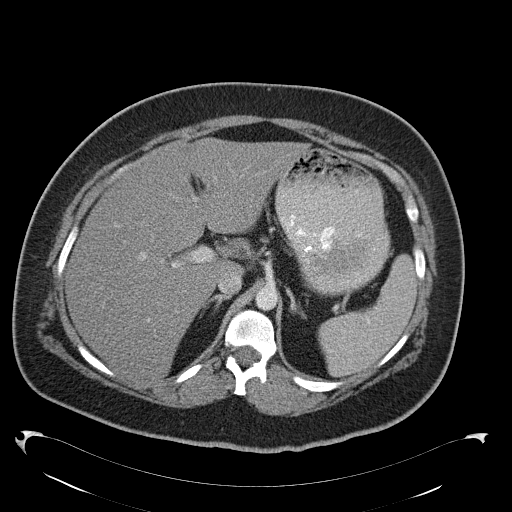
[im 81/101  soft-tissue]
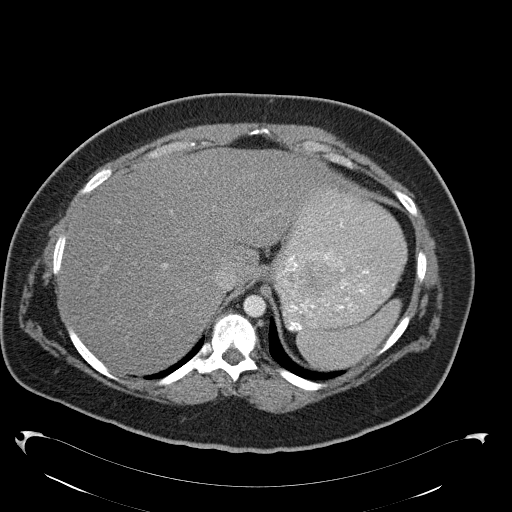
[im 89/101  soft-tissue]
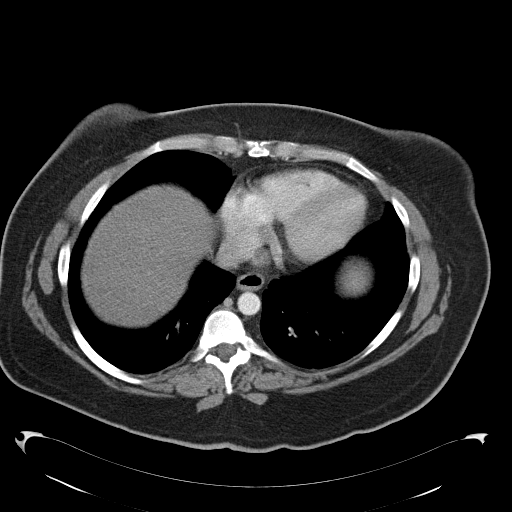
[im 97/101  soft-tissue]
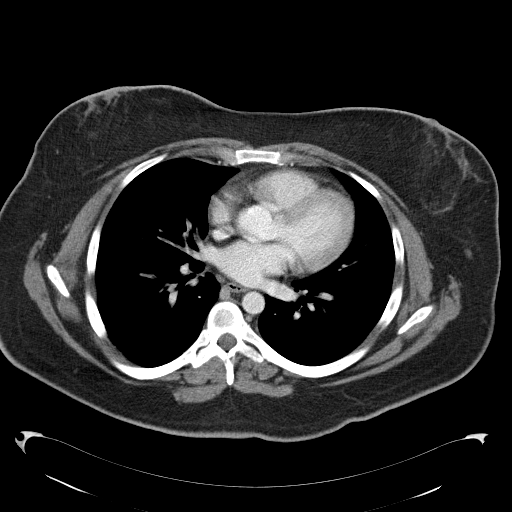

[Series 4: coronal · coronal · 0.82mm/px · 3 of 152 slices shown]
[im 51/152  soft-tissue]
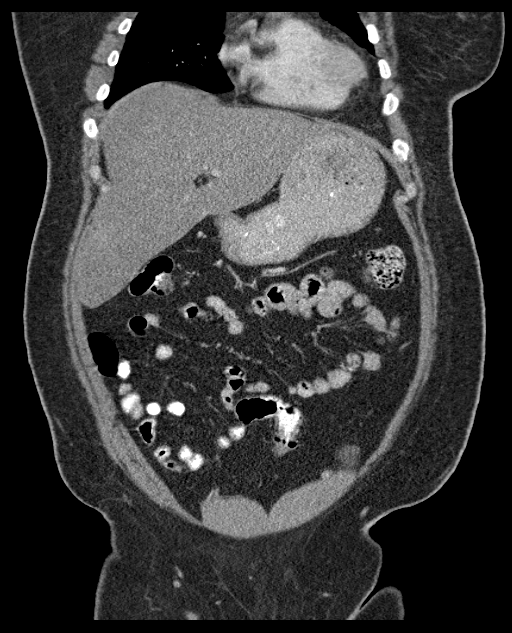
[im 68/152  soft-tissue]
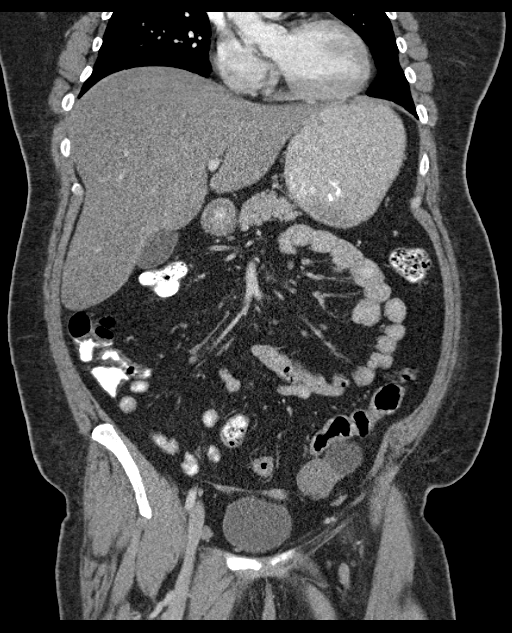
[im 84/152  soft-tissue]
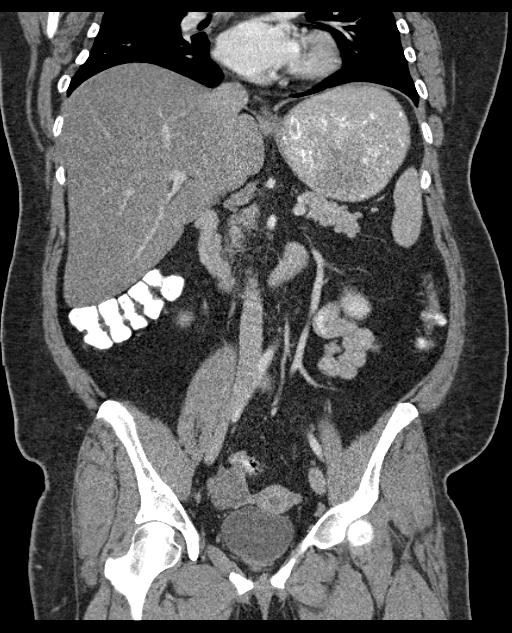

[16 of 46 positions shown; findings below may reference images not displayed]

FINDINGS: Lower chest and abdominal wall:  No contributory findings.

Hepatobiliary: Hepatic steatosis.No evidence of biliary obstruction
or stone.

Pancreas: Unremarkable.

Spleen: Unremarkable.

Adrenals/Urinary Tract: Negative adrenals. No hydronephrosis or
stone. Unremarkable bladder.

Reproductive:Prominent ovarian volume but reasonably symmetric. 5 cm
exophytic left ovarian cyst without visible complexity. A smaller
cyst/follicle was seen in this location previously.

Stomach/Bowel:  No obstruction. Appendectomy.

Vascular/Lymphatic: No acute vascular abnormality. No mass or
adenopathy.

Peritoneal: No ascites or pneumoperitoneum.

Musculoskeletal: S shaped thoracolumbar scoliosis without focal
finding.
IMPRESSION: 1. No acute finding.
2. Hepatic steatosis.
3. 5 cm left ovarian cyst appears simple but may have grown since
2676. Recommend outpatient pelvic ultrasound for characterization.
4. S shaped scoliosis.

## 2017-07-15 ENCOUNTER — Encounter (HOSPITAL_COMMUNITY): Payer: Self-pay | Admitting: Emergency Medicine

## 2017-07-15 ENCOUNTER — Emergency Department (HOSPITAL_COMMUNITY): Payer: BLUE CROSS/BLUE SHIELD

## 2017-07-15 ENCOUNTER — Emergency Department (HOSPITAL_COMMUNITY)
Admission: EM | Admit: 2017-07-15 | Discharge: 2017-07-15 | Disposition: A | Discharge status: Home / Self Care | Payer: BLUE CROSS/BLUE SHIELD | Attending: Emergency Medicine | Admitting: Emergency Medicine

## 2017-07-15 ENCOUNTER — Other Ambulatory Visit: Payer: Self-pay

## 2017-07-15 DIAGNOSIS — R0789 Other chest pain: Secondary | ICD-10-CM | POA: Insufficient documentation

## 2017-07-15 DIAGNOSIS — F17210 Nicotine dependence, cigarettes, uncomplicated: Secondary | ICD-10-CM | POA: Diagnosis not present

## 2017-07-15 DIAGNOSIS — M542 Cervicalgia: Secondary | ICD-10-CM | POA: Insufficient documentation

## 2017-07-15 DIAGNOSIS — R0602 Shortness of breath: Secondary | ICD-10-CM | POA: Insufficient documentation

## 2017-07-15 DIAGNOSIS — M546 Pain in thoracic spine: Secondary | ICD-10-CM | POA: Insufficient documentation

## 2017-07-15 DIAGNOSIS — I10 Essential (primary) hypertension: Secondary | ICD-10-CM | POA: Diagnosis not present

## 2017-07-15 DIAGNOSIS — Z79899 Other long term (current) drug therapy: Secondary | ICD-10-CM | POA: Insufficient documentation

## 2017-07-15 HISTORY — DX: Pure hypercholesterolemia, unspecified: E78.00

## 2017-07-15 LAB — COMPREHENSIVE METABOLIC PANEL
ALK PHOS: 41 U/L (ref 38–126)
ALT: 51 U/L (ref 14–54)
ANION GAP: 10 (ref 5–15)
AST: 35 U/L (ref 15–41)
Albumin: 4.1 g/dL (ref 3.5–5.0)
BUN: 13 mg/dL (ref 6–20)
CO2: 25 mmol/L (ref 22–32)
Calcium: 9.1 mg/dL (ref 8.9–10.3)
Chloride: 100 mmol/L — ABNORMAL LOW (ref 101–111)
Creatinine, Ser: 0.81 mg/dL (ref 0.44–1.00)
GFR calc Af Amer: >60 mL/min (ref >60)
GFR calc non Af Amer: >60 mL/min (ref >60)
GLUCOSE: 132 mg/dL — AB (ref 65–99)
POTASSIUM: 4.1 mmol/L (ref 3.5–5.1)
SODIUM: 135 mmol/L (ref 135–145)
Total Bilirubin: 1 mg/dL (ref 0.3–1.2)
Total Protein: 7.8 g/dL (ref 6.5–8.1)

## 2017-07-15 LAB — CBC WITH DIFFERENTIAL/PLATELET
Basophils Absolute: 0 10*3/uL (ref 0.0–0.1)
Basophils Relative: 0 %
Eosinophils Absolute: 0.4 10*3/uL (ref 0.0–0.7)
Eosinophils Relative: 4 %
HEMATOCRIT: 44.9 % (ref 36.0–46.0)
HEMOGLOBIN: 14.9 g/dL (ref 12.0–15.0)
LYMPHS ABS: 3 10*3/uL (ref 0.7–4.0)
Lymphocytes Relative: 27 %
MCH: 30 pg (ref 26.0–34.0)
MCHC: 33.2 g/dL (ref 30.0–36.0)
MCV: 90.5 fL (ref 78.0–100.0)
MONOS PCT: 7 %
Monocytes Absolute: 0.8 10*3/uL (ref 0.1–1.0)
NEUTROS ABS: 6.9 10*3/uL (ref 1.7–7.7)
NEUTROS PCT: 62 %
PLATELETS: 292 10*3/uL (ref 150–400)
RBC: 4.96 MIL/uL (ref 3.87–5.11)
RDW: 12.5 % (ref 11.5–15.5)
WBC: 11.1 10*3/uL — ABNORMAL HIGH (ref 4.0–10.5)

## 2017-07-15 LAB — HCG, QUANTITATIVE, PREGNANCY

## 2017-07-15 LAB — TROPONIN I: Troponin I: <0.03 ng/mL (ref <0.03)

## 2017-07-15 LAB — D-DIMER, QUANTITATIVE (NOT AT ARMC): D DIMER QUANT: 0.51 ug{FEU}/mL — AB (ref 0.00–0.50)

## 2017-07-15 MED ORDER — METHOCARBAMOL 500 MG PO TABS: 1000.0000 mg | ORAL_TABLET | Freq: Three times a day (TID) | ORAL | 0 refills | Status: DC | PRN

## 2017-07-15 MED ORDER — IBUPROFEN 600 MG PO TABS: 600.0000 mg | ORAL_TABLET | Freq: Three times a day (TID) | ORAL | 0 refills | Status: DC | PRN

## 2017-07-15 MED ORDER — IOPAMIDOL (ISOVUE-370) INJECTION 76%
100.0000 mL | Freq: Once | INTRAVENOUS | Status: AC | PRN
  Administered 2017-07-15: 100 mL via INTRAVENOUS

## 2017-07-15 MED ORDER — ASPIRIN 81 MG PO CHEW
324.0000 mg | CHEWABLE_TABLET | Freq: Once | ORAL | Status: AC
Start: 2017-07-15 — End: 2017-07-15
  Administered 2017-07-15: 324 mg via ORAL
  Filled 2017-07-15: qty 4

## 2017-07-15 NOTE — ED Provider Notes (Signed)
[facility] EMERGENCY DEPARTMENT Provider Note   CSN: [id number] Arrival date & time: 07/15/17  0805     History   Chief Complaint Chief Complaint  Patient presents with  . Chest Pain    HPI Dawn Ray is a 32 y.o. female.  HPI Patient presents with left-sided chest pain radiating to her left neck and left thoracic back starting yesterday evening.  States the pain has been episodic.  Currently denies any pain.  Had mild associated shortness of breath.  Denies coughing.  No heavy lifting or known trauma.  No new lower extremity swelling or pain.  No recent extended travel or immobilization.  Patient does have a history of hypertension and hypercholesterolemia.  Maternal grandfather had MI in his 40s.  Patient does smoke half a pack of cigarettes for the past 6 years. Past Medical History:  Diagnosis Date  . Chronic constipation   . GERD (gastroesophageal reflux disease)   . High cholesterol   . Hypertension     Patient Active Problem List   Diagnosis Date Noted  . Abnormal pelvic ultrasound 06/21/2016  . Hyperlipidemia 06/21/2016  . Elevated ALT measurement 06/21/2016  . Pre-diabetes 06/21/2016  . Chronic hypertension 06/13/2016  . Primary amenorrhea 06/13/2016  . Obesity 06/13/2016  . Hidradenitis suppurativa 06/13/2016  . Smoker 06/13/2016  . Bowel habit changes 04/16/2016  . GERD (gastroesophageal reflux disease) 04/16/2016  . RUQ pain 10/20/2015  . Nausea without vomiting 10/20/2015  . Constipation 09/08/2015    Past Surgical History:  Procedure Laterality Date  . APPENDECTOMY    . CHOLECYSTECTOMY N/A 10/31/2015   Procedure: LAPAROSCOPIC CHOLECYSTECTOMY;  Surgeon: Ancil Linsey, MD;  Location: [facility];  Service: General;  Laterality: N/A;    OB History    Gravida Para Term Preterm AB Living   0 0 0 0 0 0   SAB TAB Ectopic Multiple Live Births   0 0 0 0 0       Home Medications    Prior to Admission medications   Medication Sig Start Date  End Date Taking? Authorizing Provider  ergocalciferol (VITAMIN D2) 50000 units capsule Take 1 capsule by mouth once a week.   Yes [provider]  losartan-hydrochlorothiazide (HYZAAR) 50-12.5 MG tablet Take 1 tablet by mouth daily.   Yes [provider]  naproxen (NAPROSYN) 500 MG tablet Take 1 tablet (500 mg total) 2 (two) times daily with a meal by mouth. Patient taking differently: Take 500 mg by mouth daily as needed for moderate pain.  03/15/17  Yes Eber Hong, MD  pantoprazole (PROTONIX) 40 MG tablet Take 40 mg by mouth daily.   Yes [provider]  simvastatin (ZOCOR) 20 MG tablet Take 20 mg by mouth daily.   Yes [provider]  ibuprofen (ADVIL,MOTRIN) 600 MG tablet Take 1 tablet (600 mg total) by mouth 3 (three) times daily with meals as needed. 07/15/17   Loren Racer, MD  methocarbamol (ROBAXIN) 500 MG tablet Take 2 tablets (1,000 mg total) by mouth every 8 (eight) hours as needed for muscle spasms. 07/15/17   Loren Racer, MD    Family History Family History  Problem Relation Age of Onset  . Heart attack Paternal Grandfather   . Stomach cancer Paternal Grandmother   . Heart attack Maternal Grandmother   . Hypertension Maternal Grandmother   . Cancer Maternal Grandmother   . Heart failure Maternal Grandmother   . Stroke Maternal Grandfather   . Hypertension Father   . Other  Brother        died in MVA  . Colon cancer Neg Hx     Social History Social History   Tobacco Use  . Smoking status: Current Every Day Smoker    Packs/day: 0.50    Years: 6.00    Pack years: 3.00    Types: Cigarettes  . Smokeless tobacco: Never Used  . Tobacco comment: half pack daily  Substance Use Topics  . Alcohol use: Yes    Alcohol/week: 0.0 oz    Comment: occasionally  . Drug use: No     Allergies   Patient has no known allergies.   Review of Systems Review of Systems  Constitutional: Negative for chills and fever.  HENT: Negative  for congestion, sore throat and trouble swallowing.   Eyes: Negative for visual disturbance.  Respiratory: Positive for shortness of breath. Negative for cough, chest tightness and wheezing.   Cardiovascular: Positive for chest pain. Negative for palpitations and leg swelling.  Gastrointestinal: Negative for abdominal pain, constipation, diarrhea, nausea and vomiting.  Genitourinary: Negative for dysuria, flank pain and frequency.  Musculoskeletal: Negative for back pain, myalgias, neck pain and neck stiffness.  Skin: Negative for rash and wound.  Neurological: Negative for dizziness, weakness, light-headedness, numbness and headaches.  All other systems reviewed and are negative.    Physical Exam Updated Vital Signs BP (!) 102/58   Pulse 92   Temp 98.9 F (37.2 C) (Oral)   Resp 17   Ht 5\' 3"  (1.6 m)   Wt 90.7 kg (200 lb)   SpO2 100%   BMI 35.43 kg/m   Physical Exam  Constitutional: She is oriented to person, place, and time. She appears well-developed and well-nourished. No distress.  HENT:  Head: Normocephalic and atraumatic.  Mouth/Throat: Oropharynx is clear and moist. No oropharyngeal exudate.  Eyes: EOM are normal. Pupils are equal, round, and reactive to light.  Neck: Normal range of motion. Neck supple.  Cardiovascular: Normal rate and regular rhythm. Exam reveals no gallop and no friction rub.  No murmur heard. Pulmonary/Chest: Effort normal and breath sounds normal. No stridor. No respiratory distress. She has no wheezes. She has no rales. She exhibits no tenderness.  Abdominal: Soft. Bowel sounds are normal. There is no tenderness. There is no rebound and no guarding.  Musculoskeletal: Normal range of motion. She exhibits no edema or tenderness.  No lower extremity swelling, asymmetry or tenderness.  Distal pulses are 2+.  Lymphadenopathy:    She has no cervical adenopathy.  Neurological: She is alert and oriented to person, place, and time.  Moves all extremities  without focal deficit.  Sensation fully intact.  Skin: Skin is warm and dry. Capillary refill takes less than 2 seconds. No rash noted. She is not diaphoretic. No erythema.  Psychiatric: She has a normal mood and affect. Her behavior is normal.  Nursing note and vitals reviewed.    ED Treatments / Results  Labs (all labs ordered are listed, but only abnormal results are displayed) Labs Reviewed  COMPREHENSIVE METABOLIC PANEL - Abnormal; Notable for the following components:      Result Value   Chloride 100 (*)    Glucose, Bld 132 (*)    All other components within normal limits  CBC WITH DIFFERENTIAL/PLATELET - Abnormal; Notable for the following components:   WBC 11.1 (*)    All other components within normal limits  D-DIMER, QUANTITATIVE (NOT AT [facility]) - Abnormal; Notable for the following components:   D-Dimer, Quant 0.51 (*)  All other components within normal limits  TROPONIN I  HCG, QUANTITATIVE, PREGNANCY    EKG  EKG Interpretation  Date/Time:  Monday July 15 2017 08:15:39 EDT Ventricular Rate:  86 PR Interval:    QRS Duration: 75 QT Interval:  356 QTC Calculation: 426 R Axis:   31 Text Interpretation:  Sinus rhythm Low voltage, precordial leads Borderline T wave abnormalities Confirmed by Loren Racer ([id number]) on 07/15/2017 9:17:45 AM       Radiology Dg Chest 2 View  Result Date: 07/15/2017 CLINICAL DATA:  Chest pain EXAM: CHEST - 2 VIEW COMPARISON:  June 29, 2015 FINDINGS: There is no edema or consolidation. The heart size and pulmonary vascularity are normal. No adenopathy. There is lower thoracic dextroscoliosis. IMPRESSION: No edema or consolidation. Electronically Signed   By: Bretta Bang III M.D.   On: 07/15/2017 08:54   Ct Angio Chest Pe W And/or Wo Contrast  Result Date: 07/15/2017 CLINICAL DATA:  Chest pain and shortness of breath EXAM: CT ANGIOGRAPHY CHEST WITH CONTRAST TECHNIQUE: Multidetector CT imaging of the chest was performed using  the standard protocol during bolus administration of intravenous contrast. Multiplanar CT image reconstructions and MIPs were obtained to evaluate the vascular anatomy. CONTRAST:  ISOVUE-370 IOPAMIDOL (ISOVUE-370) INJECTION 76% COMPARISON:  Chest radiograph July 15, 2017 FINDINGS: Cardiovascular: There is no demonstrable pulmonary embolus. There is no thoracic aortic aneurysm or dissection. Visualized great vessels appear normal. There is no pericardial effusion or pericardial thickening. Mediastinum/Nodes: Visualized thyroid appears normal. There is no appreciable thoracic adenopathy. No esophageal lesions are noted. There is moderate fat in the anterior mediastinum. Lungs/Pleura: On axial slice 66 series 6, there is a nodular opacity abutting the pleura in the posterior segment of the right upper lobe measuring 5 x 4 mm. There is no edema or consolidation. No pleural effusion or pleural thickening evident. Upper Abdomen: In the visualized upper abdomen, there is hepatic steatosis. Gallbladder is absent. Musculoskeletal: There is lower thoracic dextroscoliosis. No blastic or lytic bone lesions evident. Review of the MIP images confirms the above findings. IMPRESSION: 1. No demonstrable pulmonary embolus. No thoracic aortic aneurysm or dissection. 2. 5 x 4 mm nodular opacity posterior segment right upper lobe. No follow-up needed if patient is low-risk. Non-contrast chest CT can be considered in 12 months if patient is high-risk. This recommendation follows the consensus statement: Guidelines for Management of Incidental Pulmonary Nodules Detected on CT Images: From the Fleischner Society 2017; Radiology 2017; 284:228-243. No edema or consolidation. 3.  No evident adenopathy. 4.  Hepatic steatosis.  Gallbladder absent. Electronically Signed   By: Bretta Bang III M.D.   On: 07/15/2017 10:51    Procedures Procedures (including critical care time)  Medications Ordered in ED Medications  aspirin  chewable tablet 324 mg (324 mg Oral Given 07/15/17 0922)  iopamidol (ISOVUE-370) 76 % injection 100 mL (100 mLs Intravenous Contrast Given 07/15/17 1035)     Initial Impression / Assessment and Plan / ED Course  I have reviewed the triage vital signs and the nursing notes.  Pertinent labs & imaging results that were available during my care of the patient were reviewed by me and considered in my medical decision making (see chart for details).      Patient continues to be chest pain-free.  Troponin is normal.  Mildly elevated d-dimer.  CT angio chest without acute findings.  Patient does have some risk factors for coronary artery disease.  She is advised to follow-up with cardiology though suspect  the etiology of her chest pain is musculoskeletal, likely related to her scoliosis.  Will treat symptomatically.  Return precautions have been given. Final Clinical Impressions(s) / ED Diagnoses   Final diagnoses:  Atypical chest pain    ED Discharge Orders        Ordered    ibuprofen (ADVIL,MOTRIN) 600 MG tablet  3 times daily with meals PRN     07/15/17 1235    methocarbamol (ROBAXIN) 500 MG tablet  Every 8 hours PRN     07/15/17 1235       Loren Racer, MD 07/15/17 1237

## 2017-07-15 NOTE — ED Triage Notes (Signed)
PT c/o left sided chest pressure radiating to her neck, left arm and upper back since the middle of the night. PT states they adjusted her HTN medications and added HCTZ and cholesterol medications at her primary doctor this week.

## 2017-12-02 ENCOUNTER — Ambulatory Visit (HOSPITAL_COMMUNITY)
Admission: RE | Admit: 2017-12-02 | Discharge: 2017-12-02 | Disposition: A | Discharge status: Home / Self Care | Payer: BLUE CROSS/BLUE SHIELD | Source: Ambulatory Visit | Attending: Adult Health Nurse Practitioner | Admitting: Adult Health Nurse Practitioner

## 2017-12-02 ENCOUNTER — Other Ambulatory Visit (HOSPITAL_COMMUNITY): Payer: Self-pay | Admitting: Adult Health Nurse Practitioner

## 2017-12-02 DIAGNOSIS — M549 Dorsalgia, unspecified: Secondary | ICD-10-CM | POA: Diagnosis present

## 2017-12-02 DIAGNOSIS — M4184 Other forms of scoliosis, thoracic region: Secondary | ICD-10-CM | POA: Insufficient documentation

## 2017-12-02 DIAGNOSIS — M545 Low back pain: Secondary | ICD-10-CM

## 2019-01-19 ENCOUNTER — Encounter: Payer: Self-pay | Admitting: Registered Nurse

## 2019-01-19 ENCOUNTER — Ambulatory Visit (INDEPENDENT_AMBULATORY_CARE_PROVIDER_SITE_OTHER): Payer: BC Managed Care – PPO | Admitting: Registered Nurse

## 2019-01-19 ENCOUNTER — Other Ambulatory Visit: Payer: Self-pay

## 2019-01-19 VITALS — BP 140/80 | HR 94 | Temp 99.0°F | Resp 16 | Ht 64.17 in | Wt 213.0 lb

## 2019-01-19 DIAGNOSIS — E119 Type 2 diabetes mellitus without complications: Secondary | ICD-10-CM

## 2019-01-19 DIAGNOSIS — Z1329 Encounter for screening for other suspected endocrine disorder: Secondary | ICD-10-CM

## 2019-01-19 DIAGNOSIS — I10 Essential (primary) hypertension: Secondary | ICD-10-CM

## 2019-01-19 DIAGNOSIS — Z13228 Encounter for screening for other metabolic disorders: Secondary | ICD-10-CM

## 2019-01-19 DIAGNOSIS — F411 Generalized anxiety disorder: Secondary | ICD-10-CM

## 2019-01-19 DIAGNOSIS — K219 Gastro-esophageal reflux disease without esophagitis: Secondary | ICD-10-CM

## 2019-01-19 DIAGNOSIS — Z130 Encounter for screening for diseases of the blood and blood-forming organs and certain disorders involving the immune mechanism: Secondary | ICD-10-CM

## 2019-01-19 DIAGNOSIS — E785 Hyperlipidemia, unspecified: Secondary | ICD-10-CM

## 2019-01-19 DIAGNOSIS — Z7689 Persons encountering health services in other specified circumstances: Secondary | ICD-10-CM | POA: Diagnosis not present

## 2019-01-19 LAB — COMPREHENSIVE METABOLIC PANEL
ALT: 44 IU/L — ABNORMAL HIGH (ref 0–32)
AST: 30 IU/L (ref 0–40)
Albumin/Globulin Ratio: 1.9 (ref 1.2–2.2)
Albumin: 4.6 g/dL (ref 3.8–4.8)
Alkaline Phosphatase: 44 IU/L (ref 39–117)
BUN/Creatinine Ratio: 17 (ref 9–23)
BUN: 12 mg/dL (ref 6–20)
Bilirubin Total: 0.3 mg/dL (ref 0.0–1.2)
CO2: 20 mmol/L (ref 20–29)
Calcium: 9.1 mg/dL (ref 8.7–10.2)
Chloride: 107 mmol/L — ABNORMAL HIGH (ref 96–106)
Creatinine, Ser: 0.71 mg/dL (ref 0.57–1.00)
GFR calc Af Amer: 129 mL/min/{1.73_m2} (ref >59)
GFR calc non Af Amer: 111 mL/min/{1.73_m2} (ref >59)
Globulin, Total: 2.4 g/dL (ref 1.5–4.5)
Glucose: 111 mg/dL — ABNORMAL HIGH (ref 65–99)
Potassium: 5 mmol/L (ref 3.5–5.2)
Sodium: 140 mmol/L (ref 134–144)
Total Protein: 7 g/dL (ref 6.0–8.5)

## 2019-01-19 LAB — CBC WITH DIFFERENTIAL/PLATELET
Basophils Absolute: 0.1 10*3/uL (ref 0.0–0.2)
Basos: 1 %
EOS (ABSOLUTE): 0.3 10*3/uL (ref 0.0–0.4)
Eos: 3 %
Hematocrit: 42.8 % (ref 34.0–46.6)
Hemoglobin: 14.2 g/dL (ref 11.1–15.9)
Immature Grans (Abs): 0 10*3/uL (ref 0.0–0.1)
Immature Granulocytes: 0 %
Lymphocytes Absolute: 2.8 10*3/uL (ref 0.7–3.1)
Lymphs: 33 %
MCH: 30.1 pg (ref 26.6–33.0)
MCHC: 33.2 g/dL (ref 31.5–35.7)
MCV: 91 fL (ref 79–97)
Monocytes Absolute: 0.5 10*3/uL (ref 0.1–0.9)
Monocytes: 6 %
Neutrophils Absolute: 4.8 10*3/uL (ref 1.4–7.0)
Neutrophils: 57 %
Platelets: 251 10*3/uL (ref 150–450)
RBC: 4.72 x10E6/uL (ref 3.77–5.28)
RDW: 11.8 % (ref 11.7–15.4)
WBC: 8.4 10*3/uL (ref 3.4–10.8)

## 2019-01-19 LAB — POCT GLYCOSYLATED HEMOGLOBIN (HGB A1C): Hemoglobin A1C: 6.1 % — AB (ref 4.0–5.6)

## 2019-01-19 LAB — LIPID PANEL
Chol/HDL Ratio: 4.7 ratio — ABNORMAL HIGH (ref 0.0–4.4)
Cholesterol, Total: 155 mg/dL (ref 100–199)
HDL: 33 mg/dL — ABNORMAL LOW (ref >39)
LDL Chol Calc (NIH): 100 mg/dL — ABNORMAL HIGH (ref 0–99)
Triglycerides: 120 mg/dL (ref 0–149)
VLDL Cholesterol Cal: 22 mg/dL (ref 5–40)

## 2019-01-19 MED ORDER — METFORMIN HCL 500 MG PO TABS: ORAL_TABLET | ORAL | 3 refills | Status: DC

## 2019-01-19 MED ORDER — LOSARTAN POTASSIUM 25 MG PO TABS: ORAL_TABLET | ORAL | 3 refills | Status: DC

## 2019-01-19 MED ORDER — PANTOPRAZOLE SODIUM 40 MG PO TBEC: 40.0000 mg | DELAYED_RELEASE_TABLET | Freq: Every day | ORAL | 3 refills | Status: DC

## 2019-01-19 MED ORDER — SIMVASTATIN 20 MG PO TABS: 20.0000 mg | ORAL_TABLET | Freq: Every day | ORAL | 3 refills | Status: DC

## 2019-01-19 MED ORDER — ESCITALOPRAM OXALATE 10 MG PO TABS: 10.0000 mg | ORAL_TABLET | Freq: Every day | ORAL | 0 refills | Status: DC
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
## 2019-01-19 NOTE — Patient Instructions (Signed)
° ° ° °  If you have lab work done today you will be contacted with your lab results within the next 2 weeks.  If you have not heard from us then please contact us. The fastest way to get your results is to register for My Chart. ° ° °IF you received an x-ray today, you will receive an invoice from [facility]. Please contact  Radiology at [phone] with questions or concerns regarding your invoice.  ° °IF you received labwork today, you will receive an invoice from [facility]. Please contact [facility] at [phone] with questions or concerns regarding your invoice.  ° °Our billing staff will not be able to assist you with questions regarding bills from these companies. ° °You will be contacted with the lab results as soon as they are available. The fastest way to get your results is to activate your My Chart account. Instructions are located on the last page of this paperwork. If you have not heard from us regarding the results in 2 weeks, please contact this office. °  ° ° ° °

## 2019-01-20 ENCOUNTER — Encounter: Payer: Self-pay | Admitting: Registered Nurse

## 2019-02-16 ENCOUNTER — Telehealth (INDEPENDENT_AMBULATORY_CARE_PROVIDER_SITE_OTHER): Payer: BC Managed Care – PPO | Admitting: Registered Nurse

## 2019-02-16 ENCOUNTER — Encounter: Payer: Self-pay | Admitting: Registered Nurse

## 2019-02-16 ENCOUNTER — Other Ambulatory Visit: Payer: Self-pay

## 2019-02-16 VITALS — Ht 63.0 in

## 2019-02-16 DIAGNOSIS — Z20828 Contact with and (suspected) exposure to other viral communicable diseases: Secondary | ICD-10-CM | POA: Diagnosis not present

## 2019-02-16 DIAGNOSIS — Z20822 Contact with and (suspected) exposure to covid-19: Secondary | ICD-10-CM

## 2019-02-16 MED ORDER — ALBUTEROL SULFATE HFA 108 (90 BASE) MCG/ACT IN AERS: 2.0000 | INHALATION_SPRAY | Freq: Four times a day (QID) | RESPIRATORY_TRACT | 0 refills | Status: DC | PRN

## 2019-02-16 MED ORDER — FLUTICASONE PROPIONATE 50 MCG/ACT NA SUSP: 2.0000 | Freq: Every day | NASAL | 6 refills | Status: DC

## 2019-02-16 NOTE — Progress Notes (Signed)
Sore throat and HA started yesterday morning   Made appt for [facility] to get tested at 1050.

## 2019-02-16 NOTE — Progress Notes (Signed)
Telemedicine Encounter- SOAP NOTE Established Patient  This telephone encounter was conducted with the patient's (or proxy's) verbal consent via audio telecommunications: yes  Patient was instructed to have this encounter in a suitably private space; and to only have persons present to whom they give permission to participate. In addition, patient identity was confirmed by use of name plus two identifiers (DOB and address).  I discussed the limitations, risks, security and privacy concerns of performing an evaluation and management service by telephone and the availability of in person appointments. I also discussed with the patient that there may be a patient responsible charge related to this service. The patient expressed understanding and agreed to proceed.  I spent a total of 12 minutes talking with the patient or their proxy.  No chief complaint on file.   Subjective   Dawn Ray is a 33 y.o. established patient. Telephone visit today for suspected COVID-19 infection.  HPI Pt reports starting yesterday morning having headache and sore throat. Notes that she works at [employer], where a number of her coworkers have tested positive for COVID-19. No other sick contacts. At this time she also endorses some rhinorrhea and sinus pressure, but denies shob, chest pain, nvd, visual changes, change to taste or smell, cyanosis, dizziness, loc.  She has an appt scheduled [facility] tomorrow morning at 10[facility]. She is most interested in symptom relief today.  Patient Active Problem List   Diagnosis Date Noted  . Type 2 diabetes mellitus without complication, without long-term current use of insulin ([place]) 01/19/2019  . GAD (generalized anxiety disorder) 01/19/2019  . Abnormal pelvic ultrasound 06/21/2016  . Hyperlipidemia 06/21/2016  . Elevated ALT measurement 06/21/2016  . Pre-diabetes 06/21/2016  . Chronic hypertension 06/13/2016  . Primary amenorrhea 06/13/2016  .  Obesity 06/13/2016  . Hidradenitis suppurativa 06/13/2016  . Smoker 06/13/2016  . Bowel habit changes 04/16/2016  . GERD (gastroesophageal reflux disease) 04/16/2016  . RUQ pain 10/20/2015  . Nausea without vomiting 10/20/2015  . Constipation 09/08/2015    Past Medical History:  Diagnosis Date  . Chronic constipation   . Diabetes mellitus without complication ([place])   . GERD (gastroesophageal reflux disease)   . High cholesterol   . Hypertension     Current Outpatient Medications  Medication Sig Dispense Refill  . escitalopram (LEXAPRO) 10 MG tablet Take 1 tablet (10 mg total) by mouth daily. 90 tablet 0  . ibuprofen (ADVIL,MOTRIN) 600 MG tablet Take 1 tablet (600 mg total) by mouth 3 (three) times daily with meals as needed. 30 tablet 0  . losartan (COZAAR) 25 MG tablet Take 1 tablet daily 90 tablet 3  . metFORMIN (GLUCOPHAGE) 500 MG tablet TK 1 T PO IN THE EVE 90 tablet 3  . methocarbamol (ROBAXIN) 500 MG tablet Take 2 tablets (1,000 mg total) by mouth every 8 (eight) hours as needed for muscle spasms. 20 tablet 0  . naproxen (NAPROSYN) 500 MG tablet Take 1 tablet (500 mg total) 2 (two) times daily with a meal by mouth. (Patient taking differently: Take 500 mg by mouth daily as needed for moderate pain. ) 30 tablet 0  . pantoprazole (PROTONIX) 40 MG tablet Take 1 tablet (40 mg total) by mouth daily. 90 tablet 3  . simvastatin (ZOCOR) 20 MG tablet Take 1 tablet (20 mg total) by mouth daily. 90 tablet 3  . ergocalciferol (VITAMIN D2) 50000 units capsule Take 1 capsule by mouth once a week.     No  current facility-administered medications for this visit.     No Known Allergies  Social History   Socioeconomic History  . Marital status: Significant Other    Spouse name: Not on file  . Number of children: 0  . Years of education: Not on file  . Highest education level: Not on file  Occupational History  . Occupation: employed  Engineer, productionocial Needs  . Financial resource strain: Not  hard at all  . Food insecurity    Worry: Never true    Inability: Never true  . Transportation needs    Medical: No    Non-medical: No  Tobacco Use  . Smoking status: Former Smoker    Packs/day: 0.50    Years: 6.00    Pack years: 3.00    Types: Cigarettes    Quit date: 07/19/2018    Years since quitting: 0.5  . Smokeless tobacco: Never Used  . Tobacco comment: half pack daily  Substance and Sexual Activity  . Alcohol use: Yes    Alcohol/week: 0.0 standard drinks    Comment: occasionally  . Drug use: No  . Sexual activity: Not Currently    Birth control/protection: None  Lifestyle  . Physical activity    Days per week: 3 days    Minutes per session: 30 min  . Stress: Rather much  Relationships  . Social Musicianconnections    Talks on phone: Three times a week    Gets together: Twice a week    Attends religious service: Patient refused    Active member of club or organization: Patient refused    Attends meetings of clubs or organizations: Patient refused    Relationship status: Living with partner  . Intimate partner violence    Fear of current or ex partner: No    Emotionally abused: No    Physically abused: No    Forced sexual activity: No  Other Topics Concern  . Not on file  Social History Narrative  . Not on file    Review of Systems  Constitutional: Positive for fever ("felt warm", does not own thermometer) and malaise/fatigue. Negative for chills, diaphoresis and weight loss.  HENT: Positive for congestion, sinus pain and sore throat. Negative for ear discharge, ear pain, hearing loss, nosebleeds and tinnitus.   Eyes: Negative.  Negative for blurred vision, double vision, photophobia, pain, discharge and redness.  Respiratory: Negative.  Negative for cough, sputum production, shortness of breath, wheezing and stridor.   Cardiovascular: Negative.  Negative for chest pain and palpitations.  Gastrointestinal: Negative.  Negative for diarrhea, nausea and vomiting.   Genitourinary: Negative.   Musculoskeletal: Negative.  Negative for joint pain and myalgias.  Skin: Negative.  Negative for itching and rash.  Neurological: Positive for headaches. Negative for dizziness, sensory change, loss of consciousness and weakness.  Endo/Heme/Allergies: Negative.   Psychiatric/Behavioral: Negative.   All other systems reviewed and are negative.   Objective   Vitals as reported by the patient: Today's Vitals   02/16/19 0908  Height: 5\' 3"  (1.6 m)    Diagnoses and all orders for this visit:  Suspected COVID-19 virus infection   PLAN  Symptom relief to be given: albuterol inhaler in anticipation of any shob, fluticasone nasal spray for congestion relief, suggest OTC lozenges or numbing spray for throat.   Reviewed in depth reasons to proceed to ED  Sending work note via MyChart - patient to be held out until test results return, will reassess at that time.   Patient encouraged to  call clinic with any questions, comments, or concerns.    I discussed the assessment and treatment plan with the patient. The patient was provided an opportunity to ask questions and all were answered. The patient agreed with the plan and demonstrated an understanding of the instructions.   The patient was advised to call back or seek an in-person evaluation if the symptoms worsen or if the condition fails to improve as anticipated.  I provided 12 minutes of non-face-to-face time during this encounter.  Janeece Agee, NP  Primary Care at [facility]

## 2019-02-16 NOTE — Patient Instructions (Signed)
° ° ° °  If you have lab work done today you will be contacted with your lab results within the next 2 weeks.  If you have not heard from us then please contact us. The fastest way to get your results is to register for My Chart. ° ° °IF you received an x-ray today, you will receive an invoice from [facility]. Please contact [facility] at [phone] with questions or concerns regarding your invoice.  ° °IF you received labwork today, you will receive an invoice from [facility]. Please contact [facility] at [phone] with questions or concerns regarding your invoice.  ° °Our billing staff will not be able to assist you with questions regarding bills from these companies. ° °You will be contacted with the lab results as soon as they are available. The fastest way to get your results is to activate your My Chart account. Instructions are located on the last page of this paperwork. If you have not heard from us regarding the results in 2 weeks, please contact this office. °  ° ° ° °

## 2019-02-19 ENCOUNTER — Encounter: Payer: Self-pay | Admitting: Registered Nurse

## 2019-02-23 ENCOUNTER — Encounter: Payer: Self-pay | Admitting: Registered Nurse

## 2019-02-23 ENCOUNTER — Ambulatory Visit (INDEPENDENT_AMBULATORY_CARE_PROVIDER_SITE_OTHER): Payer: BC Managed Care – PPO | Admitting: Registered Nurse

## 2019-02-23 ENCOUNTER — Other Ambulatory Visit: Payer: Self-pay

## 2019-02-23 VITALS — BP 130/60 | HR 83 | Temp 98.5°F | Ht 63.0 in | Wt 214.2 lb

## 2019-02-23 DIAGNOSIS — F411 Generalized anxiety disorder: Secondary | ICD-10-CM

## 2019-02-23 DIAGNOSIS — G4733 Obstructive sleep apnea (adult) (pediatric): Secondary | ICD-10-CM | POA: Diagnosis not present

## 2019-02-23 DIAGNOSIS — E119 Type 2 diabetes mellitus without complications: Secondary | ICD-10-CM | POA: Diagnosis not present

## 2019-02-23 MED ORDER — ESCITALOPRAM OXALATE 10 MG PO TABS: 10.0000 mg | ORAL_TABLET | Freq: Every day | ORAL | 3 refills | Status: DC

## 2019-02-23 NOTE — Patient Instructions (Signed)
° ° ° °  If you have lab work done today you will be contacted with your lab results within the next 2 weeks.  If you have not heard from us then please contact us. The fastest way to get your results is to register for My Chart. ° ° °IF you received an x-ray today, you will receive an invoice from [facility]. Please contact [facility] at [phone] with questions or concerns regarding your invoice.  ° °IF you received labwork today, you will receive an invoice from [facility]. Please contact [facility] at [phone] with questions or concerns regarding your invoice.  ° °Our billing staff will not be able to assist you with questions regarding bills from these companies. ° °You will be contacted with the lab results as soon as they are available. The fastest way to get your results is to activate your My Chart account. Instructions are located on the last page of this paperwork. If you have not heard from us regarding the results in 2 weeks, please contact this office. °  ° ° ° °

## 2019-02-23 NOTE — Progress Notes (Signed)
Established Patient Office Visit  Subjective:  Patient ID: Dawn Ray, female    DOB: Mar 09, 1986  Age: 33 y.o. MRN: [id number]  CC:  Chief Complaint  Patient presents with  . medical condition    5 week f/u   . Headache    sometimes wakes up with HA and other day it comes mid-day. Going on for over a week     HPI Dawn Ray presents for medication follow up and ongoing headaches.   She states that the escitalopram 10mg  PO qd that we started her on at a previous visit is going well. She states it has made a notable change in her mood, that her sleep has improved, and that her daily stress level is down. She is hopeful to stay on this medication at this dose.  She also notes that she has been having headaches upon waking in the past week. There was some concern for COVID given that she had also had a sore throat - she was tested at [facility] and the test returned negative. Since then, her throat pain has resolved but her headaches still occur. She notes she has also experienced an increase in daytime sleepiness. Denies major visual changes with the headaches, denies thunderclap headaches, denies NVD, shob, chest pain.  Otherwise feels well, demeanor has notably improved   Past Medical History:  Diagnosis Date  . Chronic constipation   . Diabetes mellitus without complication (HCC)   . GERD (gastroesophageal reflux disease)   . High cholesterol   . Hypertension     Past Surgical History:  Procedure Laterality Date  . APPENDECTOMY    . CHOLECYSTECTOMY N/A 10/31/2015   Procedure: LAPAROSCOPIC CHOLECYSTECTOMY;  Surgeon: Ancil LinseyJason Evan Davis, MD;  Location: [facility];  Service: General;  Laterality: N/A;    Family History  Problem Relation Age of Onset  . Heart attack Paternal Grandfather   . Stomach cancer Paternal Grandmother   . Heart attack Maternal Grandmother   . Hypertension Maternal Grandmother   . Heart failure Maternal Grandmother   . Stomach cancer Maternal  Grandmother   . Stroke Maternal Grandfather   . Hypertension Father   . Other Brother        died in MVA  . Colon cancer Neg Hx     Social History   Socioeconomic History  . Marital status: Significant Other    Spouse name: Not on file  . Number of children: 0  . Years of education: Not on file  . Highest education level: Not on file  Occupational History  . Occupation: employed  Engineer, productionocial Needs  . Financial resource strain: Not hard at all  . Food insecurity    Worry: Never true    Inability: Never true  . Transportation needs    Medical: No    Non-medical: No  Tobacco Use  . Smoking status: Former Smoker    Packs/day: 0.50    Years: 6.00    Pack years: 3.00    Types: Cigarettes    Quit date: 07/19/2018    Years since quitting: 0.6  . Smokeless tobacco: Never Used  . Tobacco comment: half pack daily  Substance and Sexual Activity  . Alcohol use: Yes    Alcohol/week: 0.0 standard drinks    Comment: occasionally  . Drug use: No  . Sexual activity: Not Currently    Birth control/protection: None  Lifestyle  . Physical activity    Days per week: 3 days    Minutes per  session: 30 min  . Stress: Rather much  Relationships  . Social Herbalist on phone: Three times a week    Gets together: Twice a week    Attends religious service: Patient refused    Active member of club or organization: Patient refused    Attends meetings of clubs or organizations: Patient refused    Relationship status: Living with partner  . Intimate partner violence    Fear of current or ex partner: No    Emotionally abused: No    Physically abused: No    Forced sexual activity: No  Other Topics Concern  . Not on file  Social History Narrative  . Not on file    Outpatient Medications Prior to Visit  Medication Sig Dispense Refill  . albuterol (VENTOLIN HFA) 108 (90 Base) MCG/ACT inhaler Inhale 2 puffs into the lungs every 6 (six) hours as needed for wheezing or shortness of  breath. 8 g 0  . ergocalciferol (VITAMIN D2) 50000 units capsule Take 1 capsule by mouth once a week.    . escitalopram (LEXAPRO) 10 MG tablet Take 1 tablet (10 mg total) by mouth daily. 90 tablet 0  . fluticasone (FLONASE) 50 MCG/ACT nasal spray Place 2 sprays into both nostrils daily. 16 g 6  . ibuprofen (ADVIL,MOTRIN) 600 MG tablet Take 1 tablet (600 mg total) by mouth 3 (three) times daily with meals as needed. 30 tablet 0  . losartan (COZAAR) 25 MG tablet Take 1 tablet daily 90 tablet 3  . metFORMIN (GLUCOPHAGE) 500 MG tablet TK 1 T PO IN THE EVE 90 tablet 3  . methocarbamol (ROBAXIN) 500 MG tablet Take 2 tablets (1,000 mg total) by mouth every 8 (eight) hours as needed for muscle spasms. 20 tablet 0  . naproxen (NAPROSYN) 500 MG tablet Take 1 tablet (500 mg total) 2 (two) times daily with a meal by mouth. (Patient taking differently: Take 500 mg by mouth daily as needed for moderate pain. ) 30 tablet 0  . pantoprazole (PROTONIX) 40 MG tablet Take 1 tablet (40 mg total) by mouth daily. 90 tablet 3  . simvastatin (ZOCOR) 20 MG tablet Take 1 tablet (20 mg total) by mouth daily. 90 tablet 3  . Ibuprofen-Famotidine (DUEXIS) 800-26.6 MG TABS Duexis 800 mg-26.6 mg tablet  Take 1 tablet 3 times a day by oral route.     No facility-administered medications prior to visit.     No Known Allergies  ROS Review of Systems  Constitutional: Negative.   HENT: Negative.   Eyes: Negative.   Respiratory: Negative.   Cardiovascular: Negative.   Gastrointestinal: Negative.   Endocrine: Negative.   Genitourinary: Negative.   Musculoskeletal: Negative.   Skin: Negative.   Allergic/Immunologic: Negative.   Neurological: Positive for headaches. Negative for dizziness, syncope, weakness, light-headedness and numbness.  Hematological: Negative.   Psychiatric/Behavioral: Negative.   All other systems reviewed and are negative.     Objective:    Physical Exam  Constitutional: She is oriented to  person, place, and time. She appears well-developed and well-nourished. No distress.  Cardiovascular: Normal rate and regular rhythm.  Pulmonary/Chest: Effort normal. No respiratory distress.  Neurological: She is alert and oriented to person, place, and time.  Skin: Skin is warm and dry. She is not diaphoretic.  Psychiatric: She has a normal mood and affect. Her behavior is normal. Judgment and thought content normal.  Nursing note and vitals reviewed.   BP 130/60 (BP Location: Left Arm, Patient  Position: Sitting, Cuff Size: Large)   Pulse 83   Temp 98.5 F (36.9 C) (Oral)   Ht 5\' 3"  (1.6 m)   Wt 214 lb 3.2 oz (97.2 kg)   LMP  (LMP Unknown)   SpO2 97%   BMI 37.94 kg/m  Wt Readings from Last 3 Encounters:  02/23/19 214 lb 3.2 oz (97.2 kg)  01/19/19 213 lb (96.6 kg)  07/15/17 200 lb (90.7 kg)     Health Maintenance Due  Topic Date Due  . PNEUMOCOCCAL POLYSACCHARIDE VACCINE AGE 55-64 HIGH RISK  08/31/1986  . FOOT EXAM  08/31/1994  . OPHTHALMOLOGY EXAM  08/31/1994  . TETANUS/TDAP  08/31/2003    There are no preventive care reminders to display for this patient.  Lab Results  Component Value Date   TSH 1.020 06/16/2016   Lab Results  Component Value Date   WBC 8.4 01/19/2019   HGB 14.2 01/19/2019   HCT 42.8 01/19/2019   MCV 91 01/19/2019   PLT 251 01/19/2019   Lab Results  Component Value Date   NA 140 01/19/2019   K 5.0 01/19/2019   CO2 20 01/19/2019   GLUCOSE 111 (H) 01/19/2019   BUN 12 01/19/2019   CREATININE 0.71 01/19/2019   BILITOT 0.3 01/19/2019   ALKPHOS 44 01/19/2019   AST 30 01/19/2019   ALT 44 (H) 01/19/2019   PROT 7.0 01/19/2019   ALBUMIN 4.6 01/19/2019   CALCIUM 9.1 01/19/2019   ANIONGAP 10 07/15/2017   Lab Results  Component Value Date   CHOL 155 01/19/2019   Lab Results  Component Value Date   HDL 33 (L) 01/19/2019   Lab Results  Component Value Date   LDLCALC 100 (H) 01/19/2019   Lab Results  Component Value Date   TRIG 120  01/19/2019   Lab Results  Component Value Date   CHOLHDL 4.7 (H) 01/19/2019   Lab Results  Component Value Date   HGBA1C 6.1 (A) 01/19/2019      Assessment & Plan:   Problem List Items Addressed This Visit      Endocrine   Type 2 diabetes mellitus without complication, without long-term current use of insulin (HCC)   Relevant Orders   Ambulatory referral to Ophthalmology   Ambulatory referral to Podiatry     Other   GAD (generalized anxiety disorder)   Relevant Medications   escitalopram (LEXAPRO) 10 MG tablet    Other Visit Diagnoses    OSA (obstructive sleep apnea)    -  Primary   Relevant Orders   Ambulatory referral to Pulmonology      Meds ordered this encounter  Medications  . escitalopram (LEXAPRO) 10 MG tablet    Sig: Take 1 tablet (10 mg total) by mouth daily.    Dispense:  90 tablet    Refill:  3    Order Specific Question:   Supervising Provider    Answer:   01/21/2019 Doristine Bosworth    Follow-up: Return in about 6 months (around 08/24/2019).   PLAN  Refilled escitalopram 10mg  PO qd for 1 year  Ref to pulmonology sent for suspected OSA  Referrals sent for diabetic foot and eye exams  Return in around 6 mos for POCT A1c and labs   Patient encouraged to call clinic with any questions, comments, or concerns.   08/26/2019, NP

## 2019-03-05 ENCOUNTER — Other Ambulatory Visit: Payer: Self-pay | Admitting: Registered Nurse

## 2019-03-05 DIAGNOSIS — Z20822 Contact with and (suspected) exposure to covid-19: Secondary | ICD-10-CM

## 2019-03-05 NOTE — Telephone Encounter (Signed)
Requested medication (s) are due for refill today: no  Requested medication (s) are on the active medication list: yes  Last refill:  02/16/2019  Future visit scheduled: no  Notes to clinic:  One inhaler should last one month Review for refill   Requested Prescriptions  Pending Prescriptions Disp Refills   albuterol (VENTOLIN HFA) 108 (90 Base) MCG/ACT inhaler [Pharmacy Med Name: ALBUTEROL HFA (VENTOLIN) INH]  0    Sig: TAKE 2 PUFFS BY MOUTH EVERY 6 HOURS AS NEEDED FOR WHEEZE OR SHORTNESS OF BREATH     Pulmonology:  Beta Agonists Failed - 03/05/2019  1:27 AM      Failed - One inhaler should last at least one month. If the patient is requesting refills earlier, contact the patient to check for uncontrolled symptoms.      Passed - Valid encounter within last 12 months    Recent Outpatient Visits          1 week ago OSA (obstructive sleep apnea)   [facility], NP   2 weeks ago Suspected COVID-19 virus infection   [facility], Delfino Lovett, NP   1 month ago Encounter to establish care   [facility], NP

## 2019-03-25 ENCOUNTER — Ambulatory Visit (INDEPENDENT_AMBULATORY_CARE_PROVIDER_SITE_OTHER): Payer: BC Managed Care – PPO

## 2019-03-25 ENCOUNTER — Ambulatory Visit: Payer: BC Managed Care – PPO | Admitting: Podiatry

## 2019-03-25 ENCOUNTER — Other Ambulatory Visit: Payer: Self-pay

## 2019-03-25 ENCOUNTER — Encounter: Payer: Self-pay | Admitting: Podiatry

## 2019-03-25 VITALS — BP 151/94 | HR 94

## 2019-03-25 DIAGNOSIS — M722 Plantar fascial fibromatosis: Secondary | ICD-10-CM | POA: Diagnosis not present

## 2019-03-25 DIAGNOSIS — E119 Type 2 diabetes mellitus without complications: Secondary | ICD-10-CM

## 2019-03-25 MED ORDER — MELOXICAM 7.5 MG PO TABS: 7.5000 mg | ORAL_TABLET | Freq: Every day | ORAL | 0 refills | Status: DC

## 2019-03-25 NOTE — Progress Notes (Signed)
This patient presents the office with chief complaint of a painful left heel.   She says her left heel has been painful for approximately 4 weeks.  She says she has pain upon rising in the morning and also the pain is severe after a day's activity at work.  She says she walks and still toed shoes at work.  She has provided no self treatment nor sought any professional help.  She relates that her pain is minimal some days and a 10 other days.  This patient presents the office today for an evaluation of her painful left heel  Vascular  Dorsalis pedis and posterior tibial pulses are palpable  B/L.  Capillary return  WNL.  Temperature gradient is  WNL.  Skin turgor  WNL  Sensorium  Senn Weinstein monofilament wire  WNL. Normal tactile sensation.  Nail Exam  Patient has normal nails with no evidence of bacterial or fungal infection.  Orthopedic  Exam  Muscle tone and muscle strength  WNL.  No limitations of motion feet  B/L.  No crepitus or joint effusion noted.  Foot type is unremarkable and digits show no abnormalities.  Bony prominences are unremarkable. No palpable pain elicited left heel.  Skin  No open lesions.  Normal skin texture and turgor.  Plantar fasciitis left heel.    IE.  X-rays of her left foot reveal no evidence of any bony pathology.  Patient had minimal palpable pain noted left heel today.  Discussed this condition with this patient.  Told her we can try conservative measures such as the power step insole as well as Mobic 7.5 mg.  She was told that if the problem continues to return to the office at which time we will consider more aggressive treatment   Gardiner Barefoot DPM

## 2019-03-27 ENCOUNTER — Other Ambulatory Visit: Payer: Self-pay

## 2019-03-27 ENCOUNTER — Ambulatory Visit (INDEPENDENT_AMBULATORY_CARE_PROVIDER_SITE_OTHER): Payer: BC Managed Care – PPO | Admitting: Adult Health Nurse Practitioner

## 2019-03-27 ENCOUNTER — Encounter: Payer: Self-pay | Admitting: Adult Health Nurse Practitioner

## 2019-03-27 ENCOUNTER — Ambulatory Visit: Payer: BC Managed Care – PPO | Admitting: Registered Nurse

## 2019-03-27 VITALS — BP 131/87 | HR 95 | Temp 98.5°F | Resp 12 | Ht 63.0 in | Wt 212.0 lb

## 2019-03-27 DIAGNOSIS — L732 Hidradenitis suppurativa: Secondary | ICD-10-CM

## 2019-03-27 DIAGNOSIS — E119 Type 2 diabetes mellitus without complications: Secondary | ICD-10-CM

## 2019-03-27 MED ORDER — DOXYCYCLINE HYCLATE 100 MG PO TABS: 100.0000 mg | ORAL_TABLET | Freq: Two times a day (BID) | ORAL | 0 refills | Status: DC

## 2019-03-27 MED ORDER — CLINDAMYCIN PHOSPHATE 1 % EX GEL: Freq: Two times a day (BID) | CUTANEOUS | 0 refills | Status: DC

## 2019-03-27 NOTE — Patient Instructions (Addendum)
Manual she is here whenever the patient is she is here she is both S aphasia yesterday thank you she has a past medical history anorexia bulimia.  Was previously treated at [facility] and eating disorder erythematous pharynx as well

## 2019-03-27 NOTE — Progress Notes (Signed)
  Chief Complaint  Patient presents with  . Breast Pain    Pt stated ---right under breast--have an open sore, clear drainage, redness--6 days.    HPI  Patient is evaluated in the office today for a draining area to her right lower breast.  It has been present for 4 days.  Somewhat tender.  Has not tried anything on it.  Initially it drained more than right now does not seem like it needs to.  Patient denies any erythema or fever.  No chills no night sweats she is a diabetic and last A1c was 6.2% in September.  Does not check her blood sugars regularly.  Trying to start an exercise program.  Knows that she needs to monitor her nutrition and was offered a referral to diabetic education which she will consider.  She has never been told she has had tinnitus suppurativa but it is listed in her chart as a problem.  She has no education or understanding of the course of illness and any topical treatments or oral antibiotics that can aid in diminishing returning draining cyst.  She also showed me a couple of different areas that were in the later stages of healing in the inguinal area on the right, and she gets them in the axilla often. Self limited course  Review of Systems  Constitutional: Negative for activity change, appetite change, chills and fever.  HENT: Negative for congestion, nosebleeds, trouble swallowing and voice change.   Respiratory: Negative for cough, shortness of breath and wheezing.   Gastrointestinal: Negative for diarrhea, nausea and vomiting.  Genitourinary: Negative for difficulty urinating, dysuria, flank pain and hematuria.  Musculoskeletal: Negative for back pain, joint swelling and neck pain.  Neurological: Negative for dizziness, speech difficulty, light-headedness and numbness.  \Skin:  Positive for recurrent cysts in axilla, inguinal area, and underneath breasts.   See HPI. All other review of systems negative.   Skin exam - LESIONS NOTED: normal complete skin exam, no  suspicious lesions, skin abscess on the right lower breast without fluctuance or warmth.  Right inguinal area with 2-3, palpable cysts along bikini line non-draining, non-fluctuant. .  Assessment & Plan:  Dawn Ray is a 33 y.o. female . Hidradenitis suppurativa  Type 2 diabetes mellitus without complication, without long-term current use of insulin (HCC) No orders of the defined types were placed in this encounter.  There are no Patient Instructions on file for this visit.  Per up to date, recommended initial therapy is Clindamycin 1% bid.   Wound culture obtained.  Will f/u pending results.    Will f/u prj and info given on treatment for HD.  Patient verbalized understandiung and will call or return if question for viability evaluated physical: Patient is comfortable with that  Glyn Ade, NP

## 2019-03-29 LAB — WOUND CULTURE: Organism ID, Bacteria: NONE SEEN

## 2019-04-08 ENCOUNTER — Other Ambulatory Visit: Payer: Self-pay

## 2019-04-08 ENCOUNTER — Other Ambulatory Visit: Payer: Self-pay | Admitting: *Deleted

## 2019-04-08 ENCOUNTER — Telehealth: Payer: Self-pay | Admitting: Registered Nurse

## 2019-04-08 ENCOUNTER — Telehealth (INDEPENDENT_AMBULATORY_CARE_PROVIDER_SITE_OTHER): Payer: BC Managed Care – PPO | Admitting: Registered Nurse

## 2019-04-08 DIAGNOSIS — R197 Diarrhea, unspecified: Secondary | ICD-10-CM | POA: Insufficient documentation

## 2019-04-08 MED ORDER — LOPERAMIDE HCL 2 MG PO CAPS: 2.0000 mg | ORAL_CAPSULE | ORAL | 0 refills | Status: DC | PRN

## 2019-04-08 NOTE — Progress Notes (Signed)
Telemedicine Encounter- SOAP NOTE Established Patient  This telephone encounter was conducted with the patient's (or proxy's) verbal consent via audio telecommunications: yes   Patient was instructed to have this encounter in a suitably private space; and to only have persons present to whom they give permission to participate. In addition, patient identity was confirmed by use of name plus two identifiers (DOB and address).  I discussed the limitations, risks, security and privacy concerns of performing an evaluation and management service by telephone and the availability of in person appointments. I also discussed with the patient that there may be a patient responsible charge related to this service. The patient expressed understanding and agreed to proceed.  I spent a total of 13 minutes talking with the patient or their proxy.  Chief Complaint  Patient presents with  . Results    wound culture  . Abdominal Pain     Pt stated --upper part of abdominal is painful, black stool, vomiting, less eating, headache--started yesterday. Denied fever.    Subjective   Dawn Ray is a 33 y.o. established patient. Telephone visit today for vomiting  HPI Onset yesterday with vomiting and diarrhea. Better today - vomiting resolved, diarrhea less frequent. Notes some upper abdominal pain, mostly LUQ but also epigastric. Denies melena, mucus in stool. Denies blood in vomit. No cough, chills, fever, fatigue, change to taste or smell. No changes to medication or diet. Notes that her husband had similar symptoms starting Sunday. Prior to that, her sister's boyfriend, who she had been around on Saturday, experienced similar symptoms late in the previous week.  Patient Active Problem List   Diagnosis Date Noted  . Diabetes mellitus without complication (HCC) 03/25/2019  . Type 2 diabetes mellitus without complication, without long-term current use of insulin (HCC) 01/19/2019  . GAD  (generalized anxiety disorder) 01/19/2019  . Abnormal pelvic ultrasound 06/21/2016  . Hyperlipidemia 06/21/2016  . Elevated ALT measurement 06/21/2016  . Pre-diabetes 06/21/2016  . Chronic hypertension 06/13/2016  . Primary amenorrhea 06/13/2016  . Obesity 06/13/2016  . Hidradenitis suppurativa 06/13/2016  . Smoker 06/13/2016  . Bowel habit changes 04/16/2016  . GERD (gastroesophageal reflux disease) 04/16/2016  . RUQ pain 10/20/2015  . Nausea without vomiting 10/20/2015  . Constipation 09/08/2015    Past Medical History:  Diagnosis Date  . Chronic constipation   . Diabetes mellitus without complication (HCC)   . GERD (gastroesophageal reflux disease)   . High cholesterol   . Hypertension     Current Outpatient Medications  Medication Sig Dispense Refill  . albuterol (VENTOLIN HFA) 108 (90 Base) MCG/ACT inhaler Inhale 2 puffs into the lungs every 6 (six) hours as needed for wheezing or shortness of breath. 8 g 0  . doxycycline (VIBRA-TABS) 100 MG tablet Take 1 tablet (100 mg total) by mouth 2 (two) times daily. 20 tablet 0  . escitalopram (LEXAPRO) 10 MG tablet Take 1 tablet (10 mg total) by mouth daily. 90 tablet 3  . fluticasone (FLONASE) 50 MCG/ACT nasal spray Place 2 sprays into both nostrils daily. 16 g 6  . ibuprofen (ADVIL,MOTRIN) 600 MG tablet Take 1 tablet (600 mg total) by mouth 3 (three) times daily with meals as needed. 30 tablet 0  . losartan (COZAAR) 25 MG tablet Take 1 tablet daily 90 tablet 3  . metFORMIN (GLUCOPHAGE) 500 MG tablet TK 1 T PO IN THE EVE 90 tablet 3  . methocarbamol (ROBAXIN) 500 MG tablet Take 2 tablets (1,000 mg total) by mouth  every 8 (eight) hours as needed for muscle spasms. 20 tablet 0  . naproxen (NAPROSYN) 500 MG tablet Take 1 tablet (500 mg total) 2 (two) times daily with a meal by mouth. (Patient taking differently: Take 500 mg by mouth daily as needed for moderate pain. ) 30 tablet 0  . pantoprazole (PROTONIX) 40 MG tablet Take 1 tablet  (40 mg total) by mouth daily. 90 tablet 3  . simvastatin (ZOCOR) 20 MG tablet Take 1 tablet (20 mg total) by mouth daily. 90 tablet 3  . clindamycin (CLINDAGEL) 1 % gel Apply topically 2 (two) times daily. (Patient not taking: Reported on 04/08/2019) 30 g 0  . ergocalciferol (VITAMIN D2) 50000 units capsule Take 1 capsule by mouth once a week.    . loperamide (IMODIUM) 2 MG capsule Take 1 capsule (2 mg total) by mouth as needed. For Diarrhea or loose stools. 30 capsule 0  . meloxicam (MOBIC) 7.5 MG tablet Take 1 tablet (7.5 mg total) by mouth daily. (Patient not taking: Reported on 04/08/2019) 30 tablet 0   No current facility-administered medications for this visit.     No Known Allergies  Social History   Socioeconomic History  . Marital status: Significant Other    Spouse name: Not on file  . Number of children: 0  . Years of education: Not on file  . Highest education level: Not on file  Occupational History  . Occupation: employed  Scientific laboratory technician  . Financial resource strain: Not hard at all  . Food insecurity    Worry: Never true    Inability: Never true  . Transportation needs    Medical: No    Non-medical: No  Tobacco Use  . Smoking status: Former Smoker    Packs/day: 0.50    Years: 6.00    Pack years: 3.00    Types: Cigarettes    Quit date: 07/19/2018    Years since quitting: 0.7  . Smokeless tobacco: Never Used  . Tobacco comment: half pack daily  Substance and Sexual Activity  . Alcohol use: Yes    Alcohol/week: 0.0 standard drinks    Comment: occasionally  . Drug use: No  . Sexual activity: Not Currently    Birth control/protection: None  Lifestyle  . Physical activity    Days per week: 3 days    Minutes per session: 30 min  . Stress: Rather much  Relationships  . Social Herbalist on phone: Three times a week    Gets together: Twice a week    Attends religious service: Patient refused    Active member of club or organization: Patient refused     Attends meetings of clubs or organizations: Patient refused    Relationship status: Living with partner  . Intimate partner violence    Fear of current or ex partner: No    Emotionally abused: No    Physically abused: No    Forced sexual activity: No  Other Topics Concern  . Not on file  Social History Narrative  . Not on file    Review of Systems  Constitutional: Negative.   HENT: Negative.   Eyes: Negative.   Respiratory: Negative.   Cardiovascular: Negative.   Gastrointestinal: Positive for abdominal pain, diarrhea and vomiting.  Genitourinary: Negative.   Musculoskeletal: Negative.   Skin: Negative.   Neurological: Negative.   Endo/Heme/Allergies: Negative.   Psychiatric/Behavioral: Negative.   All other systems reviewed and are negative.   Objective   Vitals as  reported by the patient: There were no vitals filed for this visit.  Judeth CornfieldStephanie was seen today for results and abdominal pain.  Diagnoses and all orders for this visit:  Diarrhea, unspecified type -     Discontinue: loperamide (IMODIUM) 2 MG capsule; Take 1 capsule (2 mg total) by mouth as needed for diarrhea or loose stools.   PLAN  Based on symptoms, course of illness for sick contacts, appears as a viral gastroenteritis. Discussed supportive care and that this should be self limited.   Given reasons to present to ED and reasons to call clinic  Suggest COVID testing given symptoms and social contact with others during pandemic  Patient encouraged to call clinic with any questions, comments, or concerns.   I discussed the assessment and treatment plan with the patient. The patient was provided an opportunity to ask questions and all were answered. The patient agreed with the plan and demonstrated an understanding of the instructions.   The patient was advised to call back or seek an in-person evaluation if the symptoms worsen or if the condition fails to improve as anticipated.  I provided 13  minutes of non-face-to-face time during this encounter.  Janeece Ageeichard Shermar Friedland, NP  [facility]

## 2019-04-09 ENCOUNTER — Encounter: Payer: Self-pay | Admitting: Registered Nurse

## 2019-04-09 ENCOUNTER — Other Ambulatory Visit: Payer: Self-pay

## 2019-04-09 DIAGNOSIS — Z20822 Contact with and (suspected) exposure to covid-19: Secondary | ICD-10-CM

## 2019-04-12 LAB — NOVEL CORONAVIRUS, NAA: SARS-CoV-2, NAA: NOT DETECTED

## 2019-04-13 ENCOUNTER — Encounter: Payer: Self-pay | Admitting: Registered Nurse

## 2019-04-14 ENCOUNTER — Encounter: Payer: Self-pay | Admitting: Registered Nurse

## 2019-04-21 ENCOUNTER — Telehealth: Payer: Self-pay | Admitting: *Deleted

## 2019-04-21 ENCOUNTER — Other Ambulatory Visit: Payer: Self-pay | Admitting: Podiatry

## 2019-04-21 NOTE — Telephone Encounter (Signed)
Called patient, left 2nd Vmessage that prescription has been sent to pharmacy on file

## 2019-04-21 NOTE — Telephone Encounter (Signed)
Called and left VM that prescription for Mobic 7.5mg  has been to pharmacy on file.

## 2019-05-11 ENCOUNTER — Encounter: Payer: Self-pay | Admitting: Registered Nurse

## 2019-05-19 ENCOUNTER — Other Ambulatory Visit: Payer: Self-pay

## 2019-05-19 ENCOUNTER — Ambulatory Visit (INDEPENDENT_AMBULATORY_CARE_PROVIDER_SITE_OTHER): Payer: BC Managed Care – PPO | Admitting: Registered Nurse

## 2019-05-19 ENCOUNTER — Encounter: Payer: Self-pay | Admitting: Registered Nurse

## 2019-05-19 VITALS — BP 117/79 | HR 94 | Temp 98.2°F | Ht 63.0 in | Wt 212.0 lb

## 2019-05-19 DIAGNOSIS — F411 Generalized anxiety disorder: Secondary | ICD-10-CM | POA: Diagnosis not present

## 2019-05-19 DIAGNOSIS — R079 Chest pain, unspecified: Secondary | ICD-10-CM | POA: Diagnosis not present

## 2019-05-19 DIAGNOSIS — E119 Type 2 diabetes mellitus without complications: Secondary | ICD-10-CM | POA: Diagnosis not present

## 2019-05-19 DIAGNOSIS — G479 Sleep disorder, unspecified: Secondary | ICD-10-CM

## 2019-05-19 LAB — POCT GLYCOSYLATED HEMOGLOBIN (HGB A1C): Hemoglobin A1C: 6.6 % — AB (ref 4.0–5.6)

## 2019-05-19 MED ORDER — ESCITALOPRAM OXALATE 20 MG PO TABS: 20.0000 mg | ORAL_TABLET | Freq: Every day | ORAL | 0 refills | Status: DC

## 2019-05-19 MED ORDER — TRAZODONE HCL 50 MG PO TABS: 25.0000 mg | ORAL_TABLET | Freq: Every evening | ORAL | 3 refills | Status: DC | PRN

## 2019-05-19 MED ORDER — DAPAGLIFLOZIN PROPANEDIOL 5 MG PO TABS: 5.0000 mg | ORAL_TABLET | Freq: Every day | ORAL | 0 refills | Status: DC

## 2019-05-19 NOTE — Progress Notes (Signed)
\   Established Patient Office Visit  Subjective:  Patient ID: Dawn Ray, female    DOB: Nov 28, 1985  Age: 34 y.o. MRN: [id number]  CC:  Chief Complaint  Patient presents with  . Headache    per pt last Monday at work glucose checked it was 199  . medication review    to discuss Lexapro and Chest Pain x 1 week    HPI Dawn Ray presents for concern for headache and malaise, as well as med check for lexapro  Notes that headache and malaise have come along with rises in her glucose, as high as 199 in preceding weeks. Concerned for glucose control. States diet has been imperfect d/t stress  Notes stress has affected her HR and cause some mild chest pain. She is confident this is anxiety, but we will get EKG to be sure there are no cardiac changes.   She has been on Lexapro 10mg  PO qd for a few months now - interested in titrating up to 20mg .  Interested in starting a t2dm agent that will not given AE as metformin has for her.   Past Medical History:  Diagnosis Date  . Chronic constipation   . Diabetes mellitus without complication ([place])   . GERD (gastroesophageal reflux disease)   . High cholesterol   . Hypertension     Past Surgical History:  Procedure Laterality Date  . APPENDECTOMY    . CHOLECYSTECTOMY N/A 10/31/2015   Procedure: LAPAROSCOPIC CHOLECYSTECTOMY;  Surgeon: Vickie Epley, MD;  Location: [facility];  Service: General;  Laterality: N/A;    Family History  Problem Relation Age of Onset  . Heart attack Paternal Grandfather   . Stomach cancer Paternal Grandmother   . Heart attack Maternal Grandmother   . Hypertension Maternal Grandmother   . Heart failure Maternal Grandmother   . Stomach cancer Maternal Grandmother   . Stroke Maternal Grandfather   . Hypertension Father   . Other Brother        died in [place]  . Colon cancer Neg Hx     Social History   Socioeconomic History  . Marital status: Significant Other    Spouse name: Not on file   . Number of children: 0  . Years of education: Not on file  . Highest education level: Not on file  Occupational History  . Occupation: employed  Tobacco Use  . Smoking status: Former Smoker    Packs/day: 0.50    Years: 6.00    Pack years: 3.00    Types: Cigarettes    Quit date: 07/19/2018    Years since quitting: 0.8  . Smokeless tobacco: Never Used  . Tobacco comment: half pack daily  Substance and Sexual Activity  . Alcohol use: Yes    Alcohol/week: 0.0 standard drinks    Comment: occasionally  . Drug use: No  . Sexual activity: Not Currently    Birth control/protection: None  Other Topics Concern  . Not on file  Social History Narrative  . Not on file   Social Determinants of Health   Financial Resource Strain: Low Risk   . Difficulty of Paying Living Expenses: Not hard at all  Food Insecurity: No Food Insecurity  . Worried About Charity fundraiser in the Last Year: Never true  . Ran Out of Food in the Last Year: Never true  Transportation Needs: No Transportation Needs  . Lack of Transportation (Medical): No  . Lack of Transportation (Non-Medical): No  Physical  Activity: Insufficiently Active  . Days of Exercise per Week: 3 days  . Minutes of Exercise per Session: 30 min  Stress: Stress Concern Present  . Feeling of Stress : Rather much  Social Connections: Unknown  . Frequency of Communication with Friends and Family: Three times a week  . Frequency of Social Gatherings with Friends and Family: Twice a week  . Attends Religious Services: Patient refused  . Active Member of Clubs or Organizations: Patient refused  . Attends Banker Meetings: Patient refused  . Marital Status: Living with partner  Intimate Partner Violence: Not At Risk  . Fear of Current or Ex-Partner: No  . Emotionally Abused: No  . Physically Abused: No  . Sexually Abused: No    Outpatient Medications Prior to Visit  Medication Sig Dispense Refill  . albuterol (VENTOLIN  HFA) 108 (90 Base) MCG/ACT inhaler Inhale 2 puffs into the lungs every 6 (six) hours as needed for wheezing or shortness of breath. 8 g 0  . clindamycin (CLINDAGEL) 1 % gel Apply topically 2 (two) times daily. 30 g 0  . fluticasone (FLONASE) 50 MCG/ACT nasal spray Place 2 sprays into both nostrils daily. 16 g 6  . losartan (COZAAR) 25 MG tablet Take 1 tablet daily 90 tablet 3  . metFORMIN (GLUCOPHAGE) 500 MG tablet TK 1 T PO IN THE EVE 90 tablet 3  . pantoprazole (PROTONIX) 40 MG tablet Take 1 tablet (40 mg total) by mouth daily. 90 tablet 3  . simvastatin (ZOCOR) 20 MG tablet Take 1 tablet (20 mg total) by mouth daily. 90 tablet 3  . escitalopram (LEXAPRO) 10 MG tablet Take 1 tablet (10 mg total) by mouth daily. 90 tablet 3  . ergocalciferol (VITAMIN D2) 50000 units capsule Take 1 capsule by mouth once a week.    Marland Kitchen ibuprofen (ADVIL,MOTRIN) 600 MG tablet Take 1 tablet (600 mg total) by mouth 3 (three) times daily with meals as needed. (Patient not taking: Reported on 05/19/2019) 30 tablet 0  . loperamide (IMODIUM) 2 MG capsule Take 1 capsule (2 mg total) by mouth as needed. For Diarrhea or loose stools. (Patient not taking: Reported on 05/19/2019) 30 capsule 0  . meloxicam (MOBIC) 7.5 MG tablet TAKE 1 TABLET BY MOUTH EVERY DAY (Patient not taking: Reported on 05/19/2019) 30 tablet 0  . methocarbamol (ROBAXIN) 500 MG tablet Take 2 tablets (1,000 mg total) by mouth every 8 (eight) hours as needed for muscle spasms. (Patient not taking: Reported on 05/19/2019) 20 tablet 0  . naproxen (NAPROSYN) 500 MG tablet Take 1 tablet (500 mg total) 2 (two) times daily with a meal by mouth. (Patient not taking: Reported on 05/19/2019) 30 tablet 0  . doxycycline (VIBRA-TABS) 100 MG tablet Take 1 tablet (100 mg total) by mouth 2 (two) times daily. 20 tablet 0   No facility-administered medications prior to visit.    No Known Allergies  ROS Review of Systems  Constitutional: Positive for fatigue. Negative for  activity change, appetite change, chills, diaphoresis, fever and unexpected weight change.  HENT: Negative.   Eyes: Negative.   Respiratory: Positive for chest tightness. Negative for cough and shortness of breath.   Cardiovascular: Negative.  Negative for chest pain, palpitations and leg swelling.  Gastrointestinal: Negative.   Endocrine: Negative.   Genitourinary: Negative.   Musculoskeletal: Negative.   Skin: Negative.   Allergic/Immunologic: Negative.   Neurological: Positive for headaches. Negative for weakness, light-headedness and numbness.  Hematological: Negative.   Psychiatric/Behavioral: Negative.  All other systems reviewed and are negative.     Objective:    Physical Exam  Constitutional: She is oriented to person, place, and time. She appears well-developed and well-nourished. No distress.  Cardiovascular: Normal rate and regular rhythm.  Pulmonary/Chest: Effort normal. No respiratory distress.  Neurological: She is alert and oriented to person, place, and time.  Skin: Skin is warm and dry. No rash noted. She is not diaphoretic. No erythema. No pallor.  Psychiatric: She has a normal mood and affect. Her behavior is normal. Judgment and thought content normal.  Nursing note and vitals reviewed.   BP 117/79   Pulse 94   Temp 98.2 F (36.8 C) (Temporal)   Ht 5\' 3"  (1.6 m) Comment: per pt  Wt 212 lb (96.2 kg)   BMI 37.55 kg/m  Wt Readings from Last 3 Encounters:  05/19/19 212 lb (96.2 kg)  03/27/19 212 lb (96.2 kg)  02/23/19 214 lb 3.2 oz (97.2 kg)     Health Maintenance Due  Topic Date Due  . FOOT EXAM  08/31/1994  . OPHTHALMOLOGY EXAM  08/31/1994  . PAP SMEAR-Modifier  06/14/2019    There are no preventive care reminders to display for this patient.  Lab Results  Component Value Date   TSH 1.020 06/16/2016   Lab Results  Component Value Date   WBC 8.4 01/19/2019   HGB 14.2 01/19/2019   HCT 42.8 01/19/2019   MCV 91 01/19/2019   PLT 251  01/19/2019   Lab Results  Component Value Date   NA 140 01/19/2019   K 5.0 01/19/2019   CO2 20 01/19/2019   GLUCOSE 111 (H) 01/19/2019   BUN 12 01/19/2019   CREATININE 0.71 01/19/2019   BILITOT 0.3 01/19/2019   ALKPHOS 44 01/19/2019   AST 30 01/19/2019   ALT 44 (H) 01/19/2019   PROT 7.0 01/19/2019   ALBUMIN 4.6 01/19/2019   CALCIUM 9.1 01/19/2019   ANIONGAP 10 07/15/2017   Lab Results  Component Value Date   CHOL 155 01/19/2019   Lab Results  Component Value Date   HDL 33 (L) 01/19/2019   Lab Results  Component Value Date   LDLCALC 100 (H) 01/19/2019   Lab Results  Component Value Date   TRIG 120 01/19/2019   Lab Results  Component Value Date   CHOLHDL 4.7 (H) 01/19/2019   Lab Results  Component Value Date   HGBA1C 6.6 (A) 05/19/2019      Assessment & Plan:   Problem List Items Addressed This Visit      Endocrine   Type 2 diabetes mellitus without complication, without long-term current use of insulin (HCC) - Primary   Relevant Medications   dapagliflozin propanediol (FARXIGA) 5 MG TABS tablet   Other Relevant Orders   POCT glycosylated hemoglobin (Hb A1C) (Completed)     Other   GAD (generalized anxiety disorder)   Relevant Medications   escitalopram (LEXAPRO) 20 MG tablet   traZODone (DESYREL) 50 MG tablet    Other Visit Diagnoses    Chest pain, unspecified type       Relevant Orders   EKG 12-Lead (Completed)   Sleep disturbance       Relevant Medications   traZODone (DESYREL) 50 MG tablet      Meds ordered this encounter  Medications  . dapagliflozin propanediol (FARXIGA) 5 MG TABS tablet    Sig: Take 5 mg by mouth daily before breakfast.    Dispense:  90 tablet    Refill:  0    Order Specific Question:   Supervising Provider    Answer:   Collie Siad A [id number]  . escitalopram (LEXAPRO) 20 MG tablet    Sig: Take 1 tablet (20 mg total) by mouth daily.    Dispense:  90 tablet    Refill:  0    Order Specific Question:    Supervising Provider    Answer:   Collie Siad A [id number]  . traZODone (DESYREL) 50 MG tablet    Sig: Take 0.5-1 tablets (25-50 mg total) by mouth at bedtime as needed for sleep.    Dispense:  30 tablet    Refill:  3    Order Specific Question:   Supervising Provider    Answer:   Doristine Bosworth [id number]    Follow-up: Return in about 3 months (around 08/17/2019).   PLAN  EKG wnl  Increase lexapro to 20mg  PO qd  Trazodone 25mg  PO qhs for sleep  Return in 6 week for med check  Start Farxiga 5mg  PO qd, return in 3 mo for med check. May titrate up to 10mg  PO qd at that time  Continue lifestyle modifications for anxiety and t2dm  Patient encouraged to call clinic with any questions, comments, or concerns.  , NP

## 2019-05-19 NOTE — Patient Instructions (Signed)
° ° ° °  If you have lab work done today you will be contacted with your lab results within the next 2 weeks.  If you have not heard from us then please contact us. The fastest way to get your results is to register for My Chart. ° ° °IF you received an x-ray today, you will receive an invoice from [facility]. Please contact [facility] at [phone] with questions or concerns regarding your invoice.  ° °IF you received labwork today, you will receive an invoice from [facility]. Please contact [facility] at [phone] with questions or concerns regarding your invoice.  ° °Our billing staff will not be able to assist you with questions regarding bills from these companies. ° °You will be contacted with the lab results as soon as they are available. The fastest way to get your results is to activate your My Chart account. Instructions are located on the last page of this paperwork. If you have not heard from us regarding the results in 2 weeks, please contact this office. °  ° ° ° °

## 2019-05-22 ENCOUNTER — Encounter: Payer: Self-pay | Admitting: Registered Nurse

## 2019-07-23 NOTE — Telephone Encounter (Signed)
complete

## 2019-08-10 ENCOUNTER — Other Ambulatory Visit: Payer: Self-pay | Admitting: Registered Nurse

## 2019-08-10 DIAGNOSIS — F411 Generalized anxiety disorder: Secondary | ICD-10-CM

## 2019-08-10 DIAGNOSIS — E119 Type 2 diabetes mellitus without complications: Secondary | ICD-10-CM

## 2019-08-11 ENCOUNTER — Telehealth: Payer: Self-pay

## 2019-08-11 NOTE — Telephone Encounter (Signed)
Patient is requesting a refill of the following medications: Requested Prescriptions    No prescriptions requested or ordered in this encounter  Lexapro 20 mg  Farxiga 5 mg  Date of patient request: 08/11/2019 Last office visit: 05/19/2019 Date of last refill: 05/19/2019 Last refill amount: 90 tabs

## 2019-08-11 NOTE — Telephone Encounter (Signed)
Sent Thanks Rich

## 2019-08-11 NOTE — Telephone Encounter (Signed)
Sent message to provider to approve 

## 2019-08-18 ENCOUNTER — Ambulatory Visit (INDEPENDENT_AMBULATORY_CARE_PROVIDER_SITE_OTHER): Payer: BC Managed Care – PPO | Admitting: Registered Nurse

## 2019-08-18 ENCOUNTER — Encounter: Payer: Self-pay | Admitting: Registered Nurse

## 2019-08-18 ENCOUNTER — Other Ambulatory Visit: Payer: Self-pay

## 2019-08-18 VITALS — BP 121/88 | HR 88 | Temp 98.5°F | Ht 63.0 in | Wt 211.2 lb

## 2019-08-18 DIAGNOSIS — Z124 Encounter for screening for malignant neoplasm of cervix: Secondary | ICD-10-CM | POA: Diagnosis not present

## 2019-08-18 DIAGNOSIS — Z111 Encounter for screening for respiratory tuberculosis: Secondary | ICD-10-CM | POA: Diagnosis not present

## 2019-08-18 DIAGNOSIS — Z23 Encounter for immunization: Secondary | ICD-10-CM

## 2019-08-18 DIAGNOSIS — E119 Type 2 diabetes mellitus without complications: Secondary | ICD-10-CM

## 2019-08-18 LAB — POCT GLYCOSYLATED HEMOGLOBIN (HGB A1C): Hemoglobin A1C: 6.8 % — AB (ref 4.0–5.6)

## 2019-08-18 MED ORDER — FARXIGA 10 MG PO TABS: 10.0000 mg | ORAL_TABLET | Freq: Every day | ORAL | 0 refills | Status: DC

## 2019-08-18 NOTE — Patient Instructions (Addendum)
     If you have lab work done today you will be contacted with your lab results within the next 2 weeks.  If you have not heard from [place] then please contact us. The fastest way to get your results is to register for My Chart.   IF you received an x-ray today, you will receive an invoice from [facility]. Please contact [facility] at [phone] with questions or concerns regarding your invoice.   IF you received labwork today, you will receive an invoice from [facility]. Please contact [facility] at [phone] with questions or concerns regarding your invoice.   Our billing staff will not be able to assist you with questions regarding bills from these companies.  You will be contacted with the lab results as soon as they are available. The fastest way to get your results is to activate your My Chart account. Instructions are located on the last page of this paperwork. If you have not heard from [place] regarding the results in 2 weeks, please contact this office.     Tuberculosis Risk Questionnaire  1. No Were you born outside the [place] in one of the following parts of the world: [place], [place], [place], [place] or [place]?    2. No Have you traveled outside the [place] and lived for more than one month in one of the following parts of the world: [place], [place], [place], [place] or [place]?    3. Yes  Do you have a compromised immune system such as from any of the following conditions:HIV/AIDS, organ or bone marrow transplantation, diabetes, immunosuppressive medicines (e.g. Prednisone, Remicaide), leukemia, lymphoma, cancer of the head or neck, gastrectomy or jejunal bypass, end-stage renal disease (on dialysis), or silicosis?     4. Yes  Have you ever or do you plan on working in: a residential care center, a health care facility, a jail or prison or homeless shelter?    5. No Have you ever: injected illegal drugs, used crack cocaine, lived  in a homeless shelter  or been in jail or prison?     6. No Have you ever been exposed to anyone with infectious tuberculosis?  7. No Have you ever had a BCG vaccine? (BCG is a vaccine for tuberculosis  (TB) used in OTHER countries, NOT in the [place]).  8. No Have you ever been advised by a health care provider NOT to have a TB skin test?  9. No Have you ever had a POSITIVE TB skin test?  IF SO, when? n/a  IF SO, were you treated with INH? n/a  IF SO, where? n/a  Tuberculosis Symptom Questionnaire  Do you currently have any of the following symptoms?  1. No Unexplained cough lasting more than 3 weeks?   2. No Unexplained fever lasting more than 3 weeks.   3. No Night Sweats (sweating that leaves the bedclothes and sheets wet)     4. No Shortness of Breath   5. No Chest Pain   6. No  Unintentional weight loss    7. No Unexplained fatigue (very tired for no reason)

## 2019-08-19 LAB — CMP14+EGFR
ALT: 41 IU/L — ABNORMAL HIGH (ref 0–32)
AST: 26 IU/L (ref 0–40)
Albumin/Globulin Ratio: 1.9 (ref 1.2–2.2)
Albumin: 4.5 g/dL (ref 3.8–4.8)
Alkaline Phosphatase: 48 IU/L (ref 39–117)
BUN/Creatinine Ratio: 13 (ref 9–23)
BUN: 10 mg/dL (ref 6–20)
Bilirubin Total: 0.3 mg/dL (ref 0.0–1.2)
CO2: 24 mmol/L (ref 20–29)
Calcium: 9.2 mg/dL (ref 8.7–10.2)
Chloride: 103 mmol/L (ref 96–106)
Creatinine, Ser: 0.76 mg/dL (ref 0.57–1.00)
GFR calc Af Amer: 118 mL/min/{1.73_m2} (ref >59)
GFR calc non Af Amer: 103 mL/min/{1.73_m2} (ref >59)
Globulin, Total: 2.4 g/dL (ref 1.5–4.5)
Glucose: 153 mg/dL — ABNORMAL HIGH (ref 65–99)
Potassium: 4.1 mmol/L (ref 3.5–5.2)
Sodium: 139 mmol/L (ref 134–144)
Total Protein: 6.9 g/dL (ref 6.0–8.5)

## 2019-08-19 LAB — LIPID PANEL
Chol/HDL Ratio: 6.4 ratio — ABNORMAL HIGH (ref 0.0–4.4)
Cholesterol, Total: 154 mg/dL (ref 100–199)
HDL: 24 mg/dL — ABNORMAL LOW (ref >39)
LDL Chol Calc (NIH): 77 mg/dL (ref 0–99)
Triglycerides: 323 mg/dL — ABNORMAL HIGH (ref 0–149)
VLDL Cholesterol Cal: 53 mg/dL — ABNORMAL HIGH (ref 5–40)

## 2019-08-19 LAB — MICROALBUMIN / CREATININE URINE RATIO
Creatinine, Urine: 110.1 mg/dL
Microalb/Creat Ratio: <3 mg/g creat (ref 0–29)
Microalbumin, Urine: <3 ug/mL

## 2019-08-20 ENCOUNTER — Ambulatory Visit: Payer: BC Managed Care – PPO

## 2019-08-20 ENCOUNTER — Other Ambulatory Visit: Payer: Self-pay

## 2019-08-20 LAB — TB SKIN TEST
Induration: 0 mm
TB Skin Test: NEGATIVE

## 2019-08-21 ENCOUNTER — Encounter: Payer: Self-pay | Admitting: Registered Nurse

## 2019-08-24 ENCOUNTER — Encounter: Payer: Self-pay | Admitting: Registered Nurse

## 2019-08-24 NOTE — Progress Notes (Signed)
Established Patient Office Visit  Subjective:  Patient ID: Dawn Ray, female    DOB: January 16, 1986  Age: 34 y.o. MRN: [id number]  CC:  Chief Complaint  Patient presents with  . Diabetes    fyi, patient delines foot exam, says she went to endo a few months ago. She will also need referral for gyn    HPI Dawn Ray presents for t2dm follow up  States things are going generally well, reports good med complaince. [place], limited activity No concerns for complications Has been seen by endo to discuss diabetes control and education  Due for t2dm eye exam, referral to be placed Due for gyn exam - requesting referral to obgyn  No further concerns today.   Past Medical History:  Diagnosis Date  . Chronic constipation   . Diabetes mellitus without complication ([place])   . GERD (gastroesophageal reflux disease)   . High cholesterol   . Hypertension     Past Surgical History:  Procedure Laterality Date  . APPENDECTOMY    . CHOLECYSTECTOMY N/A 10/31/2015   Procedure: LAPAROSCOPIC CHOLECYSTECTOMY;  Surgeon: Vickie Epley, MD;  Location: [facility];  Service: General;  Laterality: N/A;    Family History  Problem Relation Age of Onset  . Heart attack Paternal Grandfather   . Stomach cancer Paternal Grandmother   . Heart attack Maternal Grandmother   . Hypertension Maternal Grandmother   . Heart failure Maternal Grandmother   . Stomach cancer Maternal Grandmother   . Stroke Maternal Grandfather   . Hypertension Father   . Other Brother        died in [place]  . Colon cancer Neg Hx     Social History   Socioeconomic History  . Marital status: Significant Other    Spouse name: Not on file  . Number of children: 0  . Years of education: Not on file  . Highest education level: Not on file  Occupational History  . Occupation: employed  Tobacco Use  . Smoking status: Former Smoker    Packs/day: 0.50    Years: 6.00    Pack years: 3.00    Types: Cigarettes    Quit  date: 07/19/2018    Years since quitting: 1.0  . Smokeless tobacco: Never Used  . Tobacco comment: half pack daily  Substance and Sexual Activity  . Alcohol use: Yes    Alcohol/week: 0.0 standard drinks    Comment: occasionally  . Drug use: No  . Sexual activity: Not Currently    Birth control/protection: None  Other Topics Concern  . Not on file  Social History Narrative  . Not on file   Social Determinants of Health   Financial Resource Strain: Low Risk   . Difficulty of Paying Living Expenses: Not hard at all  Food Insecurity: No Food Insecurity  . Worried About Charity fundraiser in the Last Year: Never true  . Ran Out of Food in the Last Year: Never true  Transportation Needs: No Transportation Needs  . Lack of Transportation (Medical): No  . Lack of Transportation (Non-Medical): No  Physical Activity: Insufficiently Active  . Days of Exercise per Week: 3 days  . Minutes of Exercise per Session: 30 min  Stress: Stress Concern Present  . Feeling of Stress : Rather much  Social Connections: Unknown  . Frequency of Communication with Friends and Family: Three times a week  . Frequency of Social Gatherings with Friends and Family: Twice a week  .  Attends Religious Services: Patient refused  . Active Member of Clubs or Organizations: Patient refused  . Attends Archivist Meetings: Patient refused  . Marital Status: Living with partner  Intimate Partner Violence: Not At Risk  . Fear of Current or Ex-Partner: No  . Emotionally Abused: No  . Physically Abused: No  . Sexually Abused: No    Outpatient Medications Prior to Visit  Medication Sig Dispense Refill  . albuterol (VENTOLIN HFA) 108 (90 Base) MCG/ACT inhaler Inhale 2 puffs into the lungs every 6 (six) hours as needed for wheezing or shortness of breath. 8 g 0  . clindamycin (CLINDAGEL) 1 % gel Apply topically 2 (two) times daily. 30 g 0  . escitalopram (LEXAPRO) 10 MG tablet escitalopram 10 mg tablet   TAKE 1 TABLET BY MOUTH EVERY DAY    . escitalopram (LEXAPRO) 20 MG tablet TAKE 1 TABLET BY MOUTH EVERY DAY 90 tablet 0  . fluticasone (FLONASE) 50 MCG/ACT nasal spray Place 2 sprays into both nostrils daily. 16 g 6  . ibuprofen (ADVIL,MOTRIN) 600 MG tablet Take 1 tablet (600 mg total) by mouth 3 (three) times daily with meals as needed. 30 tablet 0  . loperamide (IMODIUM) 2 MG capsule Take 1 capsule (2 mg total) by mouth as needed. For Diarrhea or loose stools. 30 capsule 0  . losartan (COZAAR) 25 MG tablet Take 1 tablet daily 90 tablet 3  . meloxicam (MOBIC) 7.5 MG tablet TAKE 1 TABLET BY MOUTH EVERY DAY 30 tablet 0  . metFORMIN (GLUCOPHAGE) 500 MG tablet TK 1 T PO IN THE EVE 90 tablet 3  . methocarbamol (ROBAXIN) 500 MG tablet Take 2 tablets (1,000 mg total) by mouth every 8 (eight) hours as needed for muscle spasms. 20 tablet 0  . pantoprazole (PROTONIX) 40 MG tablet Take 1 tablet (40 mg total) by mouth daily. 90 tablet 3  . simvastatin (ZOCOR) 20 MG tablet Take 1 tablet (20 mg total) by mouth daily. 90 tablet 3  . traZODone (DESYREL) 50 MG tablet Take 0.5-1 tablets (25-50 mg total) by mouth at bedtime as needed for sleep. 30 tablet 3  . FARXIGA 5 MG TABS tablet TAKE 1 TABLET BY MOUTH DAILY BEFORE BREAKFAST 90 tablet 0  . tiZANidine (ZANAFLEX) 4 MG tablet Take 4 mg by mouth 3 (three) times daily as needed.    . ergocalciferol (VITAMIN D2) 50000 units capsule Take 1 capsule by mouth once a week.    . naproxen (NAPROSYN) 500 MG tablet Take 1 tablet (500 mg total) 2 (two) times daily with a meal by mouth. (Patient not taking: Reported on 05/19/2019) 30 tablet 0   No facility-administered medications prior to visit.    No Known Allergies  ROS Review of Systems  Constitutional: Negative.   HENT: Negative.   Eyes: Negative.   Respiratory: Negative.   Cardiovascular: Negative.   Gastrointestinal: Negative.   Endocrine: Negative.   Genitourinary: Negative.   Musculoskeletal: Negative.     Allergic/Immunologic: Negative.   Neurological: Negative.   Hematological: Negative.   Psychiatric/Behavioral: Negative.   All other systems reviewed and are negative.     Objective:    Physical Exam  Constitutional: She is oriented to person, place, and time. She appears well-developed and well-nourished. No distress.  Cardiovascular: Normal rate and regular rhythm.  Pulmonary/Chest: Effort normal. No respiratory distress.  Neurological: She is alert and oriented to person, place, and time.  Skin: Skin is warm and dry. No rash noted. She is not  diaphoretic. No erythema. No pallor.  Psychiatric: She has a normal mood and affect. Her behavior is normal. Judgment and thought content normal.  Nursing note and vitals reviewed.   BP 121/88   Pulse 88   Temp 98.5 F (36.9 C)   Ht 5' 3"  (1.6 m)   Wt 211 lb 3.2 oz (95.8 kg)   SpO2 97%   BMI 37.41 kg/m  Wt Readings from Last 3 Encounters:  08/18/19 211 lb 3.2 oz (95.8 kg)  05/19/19 212 lb (96.2 kg)  03/27/19 212 lb (96.2 kg)     Health Maintenance Due  Topic Date Due  . OPHTHALMOLOGY EXAM  Never done  . COVID-19 Vaccine (1) Never done  . PAP SMEAR-Modifier  06/14/2019    There are no preventive care reminders to display for this patient.  Lab Results  Component Value Date   TSH 1.020 06/16/2016   Lab Results  Component Value Date   WBC 8.4 01/19/2019   HGB 14.2 01/19/2019   HCT 42.8 01/19/2019   MCV 91 01/19/2019   PLT 251 01/19/2019   Lab Results  Component Value Date   NA 139 08/18/2019   K 4.1 08/18/2019   CO2 24 08/18/2019   GLUCOSE 153 (H) 08/18/2019   BUN 10 08/18/2019   CREATININE 0.76 08/18/2019   BILITOT 0.3 08/18/2019   ALKPHOS 48 08/18/2019   AST 26 08/18/2019   ALT 41 (H) 08/18/2019   PROT 6.9 08/18/2019   ALBUMIN 4.5 08/18/2019   CALCIUM 9.2 08/18/2019   ANIONGAP 10 07/15/2017   Lab Results  Component Value Date   CHOL 154 08/18/2019   Lab Results  Component Value Date   HDL 24 (L)  08/18/2019   Lab Results  Component Value Date   LDLCALC 77 08/18/2019   Lab Results  Component Value Date   TRIG 323 (H) 08/18/2019   Lab Results  Component Value Date   CHOLHDL 6.4 (H) 08/18/2019   Lab Results  Component Value Date   HGBA1C 6.8 (A) 08/18/2019      Assessment & Plan:   Problem List Items Addressed This Visit      Endocrine   Type 2 diabetes mellitus without complication, without long-term current use of insulin (HCC) - Primary   Relevant Medications   dapagliflozin propanediol (FARXIGA) 10 MG TABS tablet   Other Relevant Orders   CMP14+EGFR (Completed)   Lipid panel (Completed)   Microalbumin / creatinine urine ratio (Completed)   Ambulatory referral to Ophthalmology   POCT glycosylated hemoglobin (Hb A1C) (Completed)    Other Visit Diagnoses    Need for vaccination       Relevant Orders   TB Skin Test (Completed)   Papanicolaou smear       Relevant Orders   Ambulatory referral to Obstetrics / Gynecology      Meds ordered this encounter  Medications  . dapagliflozin propanediol (FARXIGA) 10 MG TABS tablet    Sig: Take 10 mg by mouth daily before breakfast.    Dispense:  90 tablet    Refill:  0    Order Specific Question:   Supervising Provider    Answer:   Forrest Moron [id number]    Follow-up: No follow-ups on file.   PLAN  A1c about steady from last visit  Continue farxiga 76m PO qd  Improve diet and exercise  Return in 3 mo for follow up   Patient encouraged to call clinic with any questions, comments, or concerns.  Marque Rademaker  Orland Mustard, NP

## 2019-09-09 ENCOUNTER — Encounter: Payer: Self-pay | Admitting: Registered Nurse

## 2019-09-09 ENCOUNTER — Telehealth (INDEPENDENT_AMBULATORY_CARE_PROVIDER_SITE_OTHER): Payer: BC Managed Care – PPO | Admitting: Registered Nurse

## 2019-09-09 ENCOUNTER — Other Ambulatory Visit: Payer: Self-pay

## 2019-09-09 DIAGNOSIS — J069 Acute upper respiratory infection, unspecified: Secondary | ICD-10-CM

## 2019-09-09 MED ORDER — GUAIFENESIN-DM 100-10 MG/5ML PO SYRP: 5.0000 mL | ORAL_SOLUTION | ORAL | 0 refills | Status: DC | PRN

## 2019-09-09 MED ORDER — AZELASTINE HCL 0.1 % NA SOLN: 1.0000 | Freq: Two times a day (BID) | NASAL | 12 refills | Status: DC

## 2019-09-09 MED ORDER — MONTELUKAST SODIUM 10 MG PO TABS: 10.0000 mg | ORAL_TABLET | Freq: Every day | ORAL | 3 refills | Status: DC

## 2019-09-09 MED ORDER — AMOXICILLIN-POT CLAVULANATE 875-125 MG PO TABS: 1.0000 | ORAL_TABLET | Freq: Two times a day (BID) | ORAL | 0 refills | Status: DC

## 2019-09-09 NOTE — Patient Instructions (Signed)
° ° ° °  If you have lab work done today you will be contacted with your lab results within the next 2 weeks.  If you have not heard from us then please contact us. The fastest way to get your results is to register for My Chart. ° ° °IF you received an x-ray today, you will receive an invoice from [facility]. Please contact [facility] at [phone] with questions or concerns regarding your invoice.  ° °IF you received labwork today, you will receive an invoice from [facility]. Please contact [facility] at [phone] with questions or concerns regarding your invoice.  ° °Our billing staff will not be able to assist you with questions regarding bills from these companies. ° °You will be contacted with the lab results as soon as they are available. The fastest way to get your results is to activate your My Chart account. Instructions are located on the last page of this paperwork. If you have not heard from us regarding the results in 2 weeks, please contact this office. °  ° ° ° °

## 2019-10-11 ENCOUNTER — Other Ambulatory Visit: Payer: Self-pay | Admitting: Registered Nurse

## 2019-10-11 DIAGNOSIS — G479 Sleep disorder, unspecified: Secondary | ICD-10-CM

## 2019-11-10 ENCOUNTER — Other Ambulatory Visit: Payer: Self-pay | Admitting: Registered Nurse

## 2019-11-10 DIAGNOSIS — F411 Generalized anxiety disorder: Secondary | ICD-10-CM

## 2019-11-10 DIAGNOSIS — E119 Type 2 diabetes mellitus without complications: Secondary | ICD-10-CM

## 2019-11-13 ENCOUNTER — Ambulatory Visit: Payer: BC Managed Care – PPO | Admitting: Registered Nurse

## 2019-11-16 ENCOUNTER — Encounter: Payer: Self-pay | Admitting: Registered Nurse

## 2019-11-23 NOTE — Progress Notes (Signed)
Telemedicine Encounter- SOAP NOTE Established Patient  This telephone encounter was conducted with the patient's (or proxy's) verbal consent via audio telecommunications: yes  Patient was instructed to have this encounter in a suitably private space; and to only have persons present to whom they give permission to participate. In addition, patient identity was confirmed by use of name plus two identifiers (DOB and address).  I discussed the limitations, risks, security and privacy concerns of performing an evaluation and management service by telephone and the availability of in person appointments. I also discussed with the patient that there may be a patient responsible charge related to this service. The patient expressed understanding and agreed to proceed.  I spent a total of 14 minutes talking with the patient or their proxy.  Chief Complaint  Patient presents with  . Sore Throat    patient states that she got the covid vaccine one week ago she has had  a sore throat , cough , and runny nose with yellow colored  mucus. Per     Subjective   Dawn Ray is a 34 y.o. established patient. Telephone visit today for sore throat  HPI Started around 1 week ago. She rec'd her second covid vaccine at that time. Sore throat, some pnd, cough, nasal congestion Denies headache, shob, chest pain, sensory changes,   Patient Active Problem List   Diagnosis Date Noted  . Diarrhea 04/08/2019  . Diabetes mellitus without complication (HCC) 03/25/2019  . Type 2 diabetes mellitus without complication, without long-term current use of insulin (HCC) 01/19/2019  . GAD (generalized anxiety disorder) 01/19/2019  . Abnormal pelvic ultrasound 06/21/2016  . Hyperlipidemia 06/21/2016  . Elevated ALT measurement 06/21/2016  . Pre-diabetes 06/21/2016  . Chronic hypertension 06/13/2016  . Primary amenorrhea 06/13/2016  . Obesity 06/13/2016  . Hidradenitis suppurativa 06/13/2016  . Smoker  06/13/2016  . Bowel habit changes 04/16/2016  . GERD (gastroesophageal reflux disease) 04/16/2016  . RUQ pain 10/20/2015  . Nausea without vomiting 10/20/2015  . Constipation 09/08/2015    Past Medical History:  Diagnosis Date  . Chronic constipation   . Diabetes mellitus without complication (HCC)   . GERD (gastroesophageal reflux disease)   . High cholesterol   . Hypertension     Current Outpatient Medications  Medication Sig Dispense Refill  . albuterol (VENTOLIN HFA) 108 (90 Base) MCG/ACT inhaler Inhale 2 puffs into the lungs every 6 (six) hours as needed for wheezing or shortness of breath. 8 g 0  . escitalopram (LEXAPRO) 10 MG tablet escitalopram 10 mg tablet  TAKE 1 TABLET BY MOUTH EVERY DAY    . fluticasone (FLONASE) 50 MCG/ACT nasal spray Place 2 sprays into both nostrils daily. 16 g 6  . ibuprofen (ADVIL,MOTRIN) 600 MG tablet Take 1 tablet (600 mg total) by mouth 3 (three) times daily with meals as needed. 30 tablet 0  . loperamide (IMODIUM) 2 MG capsule Take 1 capsule (2 mg total) by mouth as needed. For Diarrhea or loose stools. 30 capsule 0  . losartan (COZAAR) 25 MG tablet Take 1 tablet daily 90 tablet 3  . methocarbamol (ROBAXIN) 500 MG tablet Take 2 tablets (1,000 mg total) by mouth every 8 (eight) hours as needed for muscle spasms. 20 tablet 0  . pantoprazole (PROTONIX) 40 MG tablet Take 1 tablet (40 mg total) by mouth daily. 90 tablet 3  . simvastatin (ZOCOR) 20 MG tablet Take 1 tablet (20 mg total) by mouth daily. 90 tablet 3  . tiZANidine (  ZANAFLEX) 4 MG tablet Take 4 mg by mouth 3 (three) times daily as needed.    Marland Kitchen amoxicillin-clavulanate (AUGMENTIN) 875-125 MG tablet Take 1 tablet by mouth 2 (two) times daily. 20 tablet 0  . azelastine (ASTELIN) 0.1 % nasal spray Place 1 spray into both nostrils 2 (two) times daily. Use in each nostril as directed 30 mL 12  . clindamycin (CLINDAGEL) 1 % gel Apply topically 2 (two) times daily. (Patient not taking: Reported on  09/09/2019) 30 g 0  . escitalopram (LEXAPRO) 20 MG tablet TAKE 1 TABLET BY MOUTH EVERY DAY 90 tablet 0  . FARXIGA 10 MG TABS tablet TAKE 1 TABLET BY MOUTH DAILY BEFORE BREAKFAST 90 tablet 0  . guaiFENesin-dextromethorphan (ROBITUSSIN DM) 100-10 MG/5ML syrup Take 5 mLs by mouth every 4 (four) hours as needed for cough. 118 mL 0  . meloxicam (MOBIC) 7.5 MG tablet TAKE 1 TABLET BY MOUTH EVERY DAY (Patient not taking: Reported on 09/09/2019) 30 tablet 0  . metFORMIN (GLUCOPHAGE) 500 MG tablet TK 1 T PO IN THE EVE (Patient not taking: Reported on 09/09/2019) 90 tablet 3  . montelukast (SINGULAIR) 10 MG tablet Take 1 tablet (10 mg total) by mouth at bedtime. 30 tablet 3  . traZODone (DESYREL) 50 MG tablet TAKE 1/2 TO 1 TABLET BY MOUTH AT BEDTIME AS NEEDED FOR SLEEP 90 tablet 2   No current facility-administered medications for this visit.    No Known Allergies  Social History   Socioeconomic History  . Marital status: Significant Other    Spouse name: Not on file  . Number of children: 0  . Years of education: Not on file  . Highest education level: Not on file  Occupational History  . Occupation: employed  Tobacco Use  . Smoking status: Former Smoker    Packs/day: 0.50    Years: 6.00    Pack years: 3.00    Types: Cigarettes    Quit date: 07/19/2018    Years since quitting: 1.3  . Smokeless tobacco: Never Used  . Tobacco comment: half pack daily  Vaping Use  . Vaping Use: Never used  Substance and Sexual Activity  . Alcohol use: Yes    Alcohol/week: 0.0 standard drinks    Comment: occasionally  . Drug use: No  . Sexual activity: Not Currently    Birth control/protection: None  Other Topics Concern  . Not on file  Social History Narrative  . Not on file   Social Determinants of Health   Financial Resource Strain: Low Risk   . Difficulty of Paying Living Expenses: Not hard at all  Food Insecurity: No Food Insecurity  . Worried About Programme researcher, broadcasting/film/video in the Last Year: Never  true  . Ran Out of Food in the Last Year: Never true  Transportation Needs: No Transportation Needs  . Lack of Transportation (Medical): No  . Lack of Transportation (Non-Medical): No  Physical Activity: Insufficiently Active  . Days of Exercise per Week: 3 days  . Minutes of Exercise per Session: 30 min  Stress: Stress Concern Present  . Feeling of Stress : Rather much  Social Connections: Unknown  . Frequency of Communication with Friends and Family: Three times a week  . Frequency of Social Gatherings with Friends and Family: Twice a week  . Attends Religious Services: Patient refused  . Active Member of Clubs or Organizations: Patient refused  . Attends Banker Meetings: Patient refused  . Marital Status: Living with partner  Intimate  Partner Violence: Not At Risk  . Fear of Current or Ex-Partner: No  . Emotionally Abused: No  . Physically Abused: No  . Sexually Abused: No    ROS  Objective   Vitals as reported by the patient: There were no vitals filed for this visit.  Hoorain was seen today for sore throat.  Diagnoses and all orders for this visit:  Acute upper respiratory infection -     amoxicillin-clavulanate (AUGMENTIN) 875-125 MG tablet; Take 1 tablet by mouth 2 (two) times daily. -     montelukast (SINGULAIR) 10 MG tablet; Take 1 tablet (10 mg total) by mouth at bedtime. -     azelastine (ASTELIN) 0.1 % nasal spray; Place 1 spray into both nostrils 2 (two) times daily. Use in each nostril as directed -     guaiFENesin-dextromethorphan (ROBITUSSIN DM) 100-10 MG/5ML syrup; Take 5 mLs by mouth every 4 (four) hours as needed for cough.   PLAN  Upper respiratory infection. Feel it is bacterial given course of illness and symptoms  augmentin po bid for 7 days  Supportive care: azelastine, guaifenesin-dextromethorphan, and singulair as course is being complicated by allergies.  Patient encouraged to call clinic with any questions, comments, or  concerns.   I discussed the assessment and treatment plan with the patient. The patient was provided an opportunity to ask questions and all were answered. The patient agreed with the plan and demonstrated an understanding of the instructions.   The patient was advised to call back or seek an in-person evaluation if the symptoms worsen or if the condition fails to improve as anticipated.  I provided 11 minutes of non-face-to-face time during this encounter.  Janeece Agee, NP  Primary Care at [facility]

## 2020-01-29 ENCOUNTER — Other Ambulatory Visit: Payer: Self-pay | Admitting: Registered Nurse

## 2020-01-29 DIAGNOSIS — I10 Essential (primary) hypertension: Secondary | ICD-10-CM

## 2020-03-02 ENCOUNTER — Other Ambulatory Visit: Payer: Self-pay | Admitting: Registered Nurse

## 2020-03-02 DIAGNOSIS — F411 Generalized anxiety disorder: Secondary | ICD-10-CM

## 2020-03-02 MED ORDER — ESCITALOPRAM OXALATE 20 MG PO TABS: 20.0000 mg | ORAL_TABLET | Freq: Every day | ORAL | 0 refills | Status: DC

## 2020-03-02 NOTE — Telephone Encounter (Signed)
Copied from CRM [id number]. Topic: Quick Communication - Rx Refill/Question >> Mar 02, 2020  9:51 AM [name] wrote: Medication: escitalopram (LEXAPRO) 20 MG tablet   Has the patient contacted their pharmacy? Yes.   (Agent: If no, request that the patient contact the pharmacy for the refill.) (Agent: If yes, when and what did the pharmacy advise?)  Preferred Pharmacy (with phone number or street name): [id number] [place], [place] - [address]  Phone:  [phone] Fax:  [phone]     Agent: Please be advised that RX refills may take up to 3 business days. We ask that you follow-up with your pharmacy.

## 2020-03-02 NOTE — Telephone Encounter (Signed)
Requested Prescriptions  Pending Prescriptions Disp Refills   escitalopram (LEXAPRO) 20 MG tablet 90 tablet 0    Sig: Take 1 tablet (20 mg total) by mouth daily.     Psychiatry:  Antidepressants - SSRI Passed - 03/02/2020 10:03 AM      Passed - Valid encounter within last 6 months    Recent Outpatient Visits          5 months ago Acute upper respiratory infection   [facility], Richard, NP   6 months ago Type 2 diabetes mellitus without complication, without long-term current use of insulin [facility])   [facility], Gerlene Burdock, NP   9 months ago Type 2 diabetes mellitus without complication, without long-term current use of insulin [facility])   [facility], Gerlene Burdock, NP   10 months ago Diarrhea, unspecified type   [facility], Gerlene Burdock, NP   11 months ago Hidradenitis suppurativa   [facility] at [facility], Lonna Cobb, NP

## 2020-06-26 ENCOUNTER — Encounter (HOSPITAL_COMMUNITY): Payer: Self-pay | Admitting: *Deleted

## 2020-06-26 ENCOUNTER — Other Ambulatory Visit: Payer: Self-pay

## 2020-06-26 ENCOUNTER — Emergency Department (HOSPITAL_COMMUNITY)
Admission: EM | Admit: 2020-06-26 | Discharge: 2020-06-26 | Disposition: A | Discharge status: Home / Self Care | Payer: Self-pay | Attending: Emergency Medicine | Admitting: Emergency Medicine

## 2020-06-26 DIAGNOSIS — R109 Unspecified abdominal pain: Secondary | ICD-10-CM | POA: Insufficient documentation

## 2020-06-26 DIAGNOSIS — M549 Dorsalgia, unspecified: Secondary | ICD-10-CM | POA: Insufficient documentation

## 2020-06-26 DIAGNOSIS — Z5321 Procedure and treatment not carried out due to patient leaving prior to being seen by health care provider: Secondary | ICD-10-CM | POA: Insufficient documentation

## 2020-06-26 DIAGNOSIS — R110 Nausea: Secondary | ICD-10-CM | POA: Insufficient documentation

## 2020-06-26 LAB — URINALYSIS, ROUTINE W REFLEX MICROSCOPIC
Bilirubin Urine: NEGATIVE
Glucose, UA: NEGATIVE mg/dL
Hgb urine dipstick: NEGATIVE
Ketones, ur: NEGATIVE mg/dL
Leukocytes,Ua: NEGATIVE
Nitrite: NEGATIVE
Protein, ur: NEGATIVE mg/dL
Specific Gravity, Urine: 1.013 (ref 1.005–1.030)
pH: 8 (ref 5.0–8.0)

## 2020-06-26 LAB — CBC
HCT: 42.8 % (ref 36.0–46.0)
Hemoglobin: 14.7 g/dL (ref 12.0–15.0)
MCH: 31.3 pg (ref 26.0–34.0)
MCHC: 34.3 g/dL (ref 30.0–36.0)
MCV: 91.3 fL (ref 80.0–100.0)
Platelets: 255 10*3/uL (ref 150–400)
RBC: 4.69 MIL/uL (ref 3.87–5.11)
RDW: 12.5 % (ref 11.5–15.5)
WBC: 9.7 10*3/uL (ref 4.0–10.5)
nRBC: 0 % (ref 0.0–0.2)

## 2020-06-26 LAB — COMPREHENSIVE METABOLIC PANEL
ALT: 27 U/L (ref 0–44)
AST: 18 U/L (ref 15–41)
Albumin: 4.1 g/dL (ref 3.5–5.0)
Alkaline Phosphatase: 41 U/L (ref 38–126)
Anion gap: 8 (ref 5–15)
BUN: 13 mg/dL (ref 6–20)
CO2: 31 mmol/L (ref 22–32)
Calcium: 9.3 mg/dL (ref 8.9–10.3)
Chloride: 99 mmol/L (ref 98–111)
Creatinine, Ser: 0.73 mg/dL (ref 0.44–1.00)
GFR, Estimated: >60 mL/min (ref >60)
Glucose, Bld: 149 mg/dL — ABNORMAL HIGH (ref 70–99)
Potassium: 3.9 mmol/L (ref 3.5–5.1)
Sodium: 138 mmol/L (ref 135–145)
Total Bilirubin: 0.4 mg/dL (ref 0.3–1.2)
Total Protein: 6.9 g/dL (ref 6.5–8.1)

## 2020-06-26 LAB — CBG MONITORING, ED: Glucose-Capillary: 190 mg/dL — ABNORMAL HIGH (ref 70–99)

## 2020-06-26 LAB — I-STAT BETA HCG BLOOD, ED (MC, WL, AP ONLY): I-stat hCG, quantitative: 25.2 m[IU]/mL — ABNORMAL HIGH (ref <5)

## 2020-06-26 LAB — LIPASE, BLOOD: Lipase: 58 U/L — ABNORMAL HIGH (ref 11–51)

## 2020-06-26 NOTE — ED Notes (Signed)
Patient states she thinks she is going to leave

## 2020-06-26 NOTE — ED Triage Notes (Signed)
Pt states left sided abdominal pain and back pain. Nausea. No difficulty urinating. States she is not diabetic, but her sugars have been in the 200's and down into the 40's

## 2020-06-27 ENCOUNTER — Encounter (HOSPITAL_BASED_OUTPATIENT_CLINIC_OR_DEPARTMENT_OTHER): Payer: Self-pay

## 2020-06-27 ENCOUNTER — Emergency Department (HOSPITAL_BASED_OUTPATIENT_CLINIC_OR_DEPARTMENT_OTHER)
Admission: EM | Admit: 2020-06-27 | Discharge: 2020-06-27 | Disposition: A | Discharge status: Home / Self Care | Payer: Self-pay | Attending: Emergency Medicine | Admitting: Emergency Medicine

## 2020-06-27 ENCOUNTER — Other Ambulatory Visit (HOSPITAL_COMMUNITY): Payer: Self-pay | Admitting: Emergency Medicine

## 2020-06-27 ENCOUNTER — Other Ambulatory Visit: Payer: Self-pay

## 2020-06-27 DIAGNOSIS — E1165 Type 2 diabetes mellitus with hyperglycemia: Secondary | ICD-10-CM | POA: Insufficient documentation

## 2020-06-27 DIAGNOSIS — Z87891 Personal history of nicotine dependence: Secondary | ICD-10-CM | POA: Insufficient documentation

## 2020-06-27 DIAGNOSIS — R739 Hyperglycemia, unspecified: Secondary | ICD-10-CM

## 2020-06-27 DIAGNOSIS — I10 Essential (primary) hypertension: Secondary | ICD-10-CM | POA: Insufficient documentation

## 2020-06-27 DIAGNOSIS — E119 Type 2 diabetes mellitus without complications: Secondary | ICD-10-CM

## 2020-06-27 DIAGNOSIS — Z79899 Other long term (current) drug therapy: Secondary | ICD-10-CM | POA: Insufficient documentation

## 2020-06-27 LAB — CBG MONITORING, ED: Glucose-Capillary: 143 mg/dL — ABNORMAL HIGH (ref 70–99)

## 2020-06-27 LAB — PREGNANCY, URINE: Preg Test, Ur: NEGATIVE

## 2020-06-27 MED ORDER — METFORMIN HCL 500 MG PO TABS: 500.0000 mg | ORAL_TABLET | Freq: Every day | ORAL | 0 refills | Status: DC

## 2020-06-27 MED FILL — METFORMIN HCL 500 MG TABS: 500 | 30 days supply | Qty: 30 | Fill #0

## 2020-06-27 NOTE — ED Notes (Signed)
ED Provider at bedside. 

## 2020-06-27 NOTE — ED Provider Notes (Signed)
[facility] EMERGENCY DEPARTMENT Provider Note   CSN: [id number] Arrival date & time: 06/27/20  0737     History Chief Complaint  Patient presents with  . Hyperglycemia    Dawn Ray is a 35 y.o. female.  Patient with blood sugars between 50-200 for the past several weeks/months. Use to be on medications for DM but no longer has insurance. AT ED yesterday but left without being seen. No pain.   The history is provided by the patient.  Hyperglycemia Severity:  Mild Onset quality:  Gradual Timing:  Intermittent Chronicity:  New Current diabetic treatments: hx of DM no longer taking meds. Context: noncompliance   Relieved by:  Nothing Ineffective treatments:  None tried Associated symptoms: no abdominal pain, no chest pain, no dysuria, no fever, no shortness of breath and no vomiting        Past Medical History:  Diagnosis Date  . Chronic constipation   . Diabetes mellitus without complication (HCC)   . GERD (gastroesophageal reflux disease)   . High cholesterol   . Hypertension     Patient Active Problem List   Diagnosis Date Noted  . Diarrhea 04/08/2019  . Diabetes mellitus without complication (HCC) 03/25/2019  . Type 2 diabetes mellitus without complication, without long-term current use of insulin (HCC) 01/19/2019  . GAD (generalized anxiety disorder) 01/19/2019  . Abnormal pelvic ultrasound 06/21/2016  . Hyperlipidemia 06/21/2016  . Elevated ALT measurement 06/21/2016  . Pre-diabetes 06/21/2016  . Chronic hypertension 06/13/2016  . Primary amenorrhea 06/13/2016  . Obesity 06/13/2016  . Hidradenitis suppurativa 06/13/2016  . Smoker 06/13/2016  . Bowel habit changes 04/16/2016  . GERD (gastroesophageal reflux disease) 04/16/2016  . RUQ pain 10/20/2015  . Nausea without vomiting 10/20/2015  . Constipation 09/08/2015    Past Surgical History:  Procedure Laterality Date  . APPENDECTOMY    . CHOLECYSTECTOMY N/A 10/31/2015   Procedure:  LAPAROSCOPIC CHOLECYSTECTOMY;  Surgeon: Ancil Linsey, MD;  Location: [facility];  Service: General;  Laterality: N/A;     OB History    Gravida  0   Para  0   Term  0   Preterm  0   AB  0   Living  0     SAB  0   IAB  0   Ectopic  0   Multiple  0   Live Births  0           Family History  Problem Relation Age of Onset  . Heart attack Paternal Grandfather   . Stomach cancer Paternal Grandmother   . Heart attack Maternal Grandmother   . Hypertension Maternal Grandmother   . Heart failure Maternal Grandmother   . Stomach cancer Maternal Grandmother   . Stroke Maternal Grandfather   . Hypertension Father   . Other Brother        died in MVA  . Colon cancer Neg Hx     Social History   Tobacco Use  . Smoking status: Former Smoker    Packs/day: 0.50    Years: 6.00    Pack years: 3.00    Types: Cigarettes    Quit date: 07/19/2018    Years since quitting: 1.9  . Smokeless tobacco: Never Used  . Tobacco comment: half pack daily  Vaping Use  . Vaping Use: Never used  Substance Use Topics  . Alcohol use: Yes    Alcohol/week: 0.0 standard drinks    Comment: occasionally  . Drug use: No  Home Medications Prior to Admission medications   Medication Sig Start Date End Date Taking? Authorizing Provider  albuterol (VENTOLIN HFA) 108 (90 Base) MCG/ACT inhaler Inhale 2 puffs into the lungs every 6 (six) hours as needed for wheezing or shortness of breath. 02/16/19   Janeece AgeeMorrow, Richard, NP  amoxicillin-clavulanate (AUGMENTIN) 875-125 MG tablet Take 1 tablet by mouth 2 (two) times daily. 09/09/19   Janeece AgeeMorrow, Richard, NP  azelastine (ASTELIN) 0.1 % nasal spray Place 1 spray into both nostrils 2 (two) times daily. Use in each nostril as directed 09/09/19   Janeece AgeeMorrow, Richard, NP  clindamycin (CLINDAGEL) 1 % gel Apply topically 2 (two) times daily. Patient not taking: Reported on 09/09/2019 03/27/19   Royal HawthornByrd, Sarah Arnette, NP  escitalopram (LEXAPRO) 10 MG tablet escitalopram 10  mg tablet  TAKE 1 TABLET BY MOUTH EVERY DAY    [provider]  escitalopram (LEXAPRO) 20 MG tablet Take 1 tablet (20 mg total) by mouth daily. 03/02/20   Janeece AgeeMorrow, Richard, NP  FARXIGA 10 MG TABS tablet TAKE 1 TABLET BY MOUTH DAILY BEFORE BREAKFAST 11/10/19   Janeece AgeeMorrow, Richard, NP  fluticasone [facility](FLONASE) 50 MCG/ACT nasal spray Place 2 sprays into both nostrils daily. 02/16/19   Janeece AgeeMorrow, Richard, NP  guaiFENesin-dextromethorphan (ROBITUSSIN DM) 100-10 MG/5ML syrup Take 5 mLs by mouth every 4 (four) hours as needed for cough. 09/09/19   Janeece AgeeMorrow, Richard, NP  ibuprofen (ADVIL,MOTRIN) 600 MG tablet Take 1 tablet (600 mg total) by mouth 3 (three) times daily with meals as needed. 07/15/17   Loren RacerYelverton, David, MD  loperamide (IMODIUM) 2 MG capsule Take 1 capsule (2 mg total) by mouth as needed. For Diarrhea or loose stools. 04/08/19   Janeece AgeeMorrow, Richard, NP  losartan (COZAAR) 25 MG tablet TAKE 1 TABLET BY MOUTH EVERY DAY 01/29/20   Janeece AgeeMorrow, Richard, NP  meloxicam (MOBIC) 7.5 MG tablet TAKE 1 TABLET BY MOUTH EVERY DAY Patient not taking: Reported on 09/09/2019 04/21/19   Helane GuntherMayer, Gregory, DPM  metFORMIN (GLUCOPHAGE) 500 MG tablet Take 1 tablet (500 mg total) by mouth daily with breakfast. 06/27/20 07/27/20 Yes Yvaine Jankowiak, DO  methocarbamol (ROBAXIN) 500 MG tablet Take 2 tablets (1,000 mg total) by mouth every 8 (eight) hours as needed for muscle spasms. 07/15/17   Loren RacerYelverton, David, MD  montelukast (SINGULAIR) 10 MG tablet Take 1 tablet (10 mg total) by mouth at bedtime. 09/09/19   Janeece AgeeMorrow, Richard, NP  pantoprazole (PROTONIX) 40 MG tablet Take 1 tablet (40 mg total) by mouth daily. 01/19/19   Janeece AgeeMorrow, Richard, NP  simvastatin (ZOCOR) 20 MG tablet Take 1 tablet (20 mg total) by mouth daily. 01/19/19   Janeece AgeeMorrow, Richard, NP  tiZANidine (ZANAFLEX) 4 MG tablet Take 4 mg by mouth 3 (three) times daily as needed. 04/20/19   [provider]  traZODone (DESYREL) 50 MG tablet TAKE 1/2 TO 1 TABLET BY MOUTH AT BEDTIME AS NEEDED  FOR SLEEP 10/12/19   Janeece AgeeMorrow, Richard, NP    Allergies    Patient has no known allergies.  Review of Systems   Review of Systems  Constitutional: Negative for chills and fever.  HENT: Negative for ear pain and sore throat.   Eyes: Negative for pain and visual disturbance.  Respiratory: Negative for cough and shortness of breath.   Cardiovascular: Negative for chest pain and palpitations.  Gastrointestinal: Negative for abdominal pain and vomiting.  Genitourinary: Negative for dysuria and hematuria.  Musculoskeletal: Negative for arthralgias and back pain.  Skin: Negative for color change and rash.  Neurological: Negative for  seizures and syncope.  All other systems reviewed and are negative.   Physical Exam Updated Vital Signs BP 137/84 (BP Location: Right Arm)   Pulse 91   Temp 97.6 F (36.4 C) (Oral)   Resp (!) 99   Ht 5\' 3"  (1.6 m)   Wt 90.7 kg   SpO2 98%   BMI 35.43 kg/m   Physical Exam Vitals and nursing note reviewed.  Constitutional:      General: She is not in acute distress.    Appearance: She is well-developed and well-nourished. She is not ill-appearing.  HENT:     Head: Normocephalic and atraumatic.     Nose: Nose normal.     Mouth/Throat:     Mouth: Mucous membranes are moist.  Eyes:     Extraocular Movements: Extraocular movements intact.     Conjunctiva/sclera: Conjunctivae normal.     Pupils: Pupils are equal, round, and reactive to light.  Cardiovascular:     Rate and Rhythm: Normal rate and regular rhythm.     Pulses: Normal pulses.     Heart sounds: Normal heart sounds. No murmur heard.   Pulmonary:     Effort: Pulmonary effort is normal. No respiratory distress.     Breath sounds: Normal breath sounds.  Abdominal:     Palpations: Abdomen is soft.     Tenderness: There is no abdominal tenderness.  Musculoskeletal:        General: No edema.     Cervical back: Normal range of motion and neck supple.  Skin:    General: Skin is warm and dry.      Capillary Refill: Capillary refill takes less than 2 seconds.  Neurological:     General: No focal deficit present.     Mental Status: She is alert.  Psychiatric:        Mood and Affect: Mood and affect normal.     ED Results / Procedures / Treatments   Labs (all labs ordered are listed, but only abnormal results are displayed) Labs Reviewed  CBG MONITORING, ED - Abnormal; Notable for the following components:      Result Value   Glucose-Capillary 143 (*)    All other components within normal limits  PREGNANCY, URINE    EKG None  Radiology No results found.  Procedures Procedures   Medications Ordered in ED Medications - No data to display  ED Course  I have reviewed the triage vital signs and the nursing notes.  Pertinent labs & imaging results that were available during my care of the patient were reviewed by me and considered in my medical decision making (see chart for details).    MDM Rules/Calculators/A&P                          Dawn Ray is a 35 year old female with history of diabetes but not on medications who presents to the ED with high blood sugar.  Normal vitals.  No fever.  Blood sugar in the 140s.  Overall well-appearing.  Had lab work done yesterday that was overall unremarkable.  Blood sugar was in the 140s as well.  Used to be on Metformin and other oral diabetic medication.  Does not have insurance and has not had medications.  She denies any abdominal pain, nausea, vomiting.  Her i-STAT hCG was mildly elevated 25 we will get a urine pregnancy test.  She is not having any abdominal pain, vaginal bleeding.  Possibly false elevation  versus extremely early pregnancy.  Will check urine pregnancy and recommend that she continue to check urine pregnancy at home.  May be a follow-up with an OB/GYN if she has a positive test here.  We will start her back on Metformin and refer her to the wellness center for further care.  Pregnancy test negative.   Will have her follow-up with wellness center.  This chart was dictated using voice recognition software.  Despite best efforts to proofread,  errors can occur which can change the documentation meaning.    Final Clinical Impression(s) / ED Diagnoses Final diagnoses:  Hyperglycemia    Rx / DC Orders ED Discharge Orders         Ordered    metFORMIN (GLUCOPHAGE) 500 MG tablet  Daily with breakfast        06/27/20 0831           Virgina Norfolk, DO 06/27/20 [phone]

## 2020-06-27 NOTE — ED Triage Notes (Signed)
Pt reports fluctuating blood sugars x 3 weeks. Pt reports as low as 41 and high as 225. Pt reports not checking her blood sugar today. Pt in between insurance and cannot see her pcp. Pt reports taking nothing for her blood sugar control.

## 2020-06-27 NOTE — ED Notes (Signed)
CBG 143  

## 2020-06-29 ENCOUNTER — Other Ambulatory Visit: Payer: Self-pay | Admitting: Registered Nurse

## 2020-06-29 DIAGNOSIS — F411 Generalized anxiety disorder: Secondary | ICD-10-CM

## 2020-06-29 NOTE — Telephone Encounter (Signed)
Courtesy refill. Requested Prescriptions  Pending Prescriptions Disp Refills  . escitalopram (LEXAPRO) 20 MG tablet [Pharmacy Med Name: ESCITALOPRAM 20 MG TABLET] 90 tablet 0    Sig: TAKE 1 TABLET BY MOUTH EVERY DAY     Psychiatry:  Antidepressants - SSRI Failed - 06/29/2020  4:34 AM      Failed - Valid encounter within last 6 months    Recent Outpatient Visits          9 months ago Acute upper respiratory infection   [facility], Richard, NP   10 months ago Type 2 diabetes mellitus without complication, without long-term current use of insulin (HCC)   [facility], Gerlene Burdock, NP   1 year ago Type 2 diabetes mellitus without complication, without long-term current use of insulin [facility])   [facility], Gerlene Burdock, NP   1 year ago Diarrhea, unspecified type   [facility], Gerlene Burdock, NP   1 year ago Hidradenitis suppurativa   [facility], Lonna Cobb, NP      Future Appointments            In 2 months Marcine Matar, MD [facility]

## 2020-07-14 ENCOUNTER — Other Ambulatory Visit: Payer: Self-pay

## 2020-07-14 ENCOUNTER — Emergency Department (HOSPITAL_BASED_OUTPATIENT_CLINIC_OR_DEPARTMENT_OTHER)
Admission: EM | Admit: 2020-07-14 | Discharge: 2020-07-14 | Disposition: A | Discharge status: Home / Self Care | Payer: Self-pay | Attending: Emergency Medicine | Admitting: Emergency Medicine

## 2020-07-14 ENCOUNTER — Encounter (HOSPITAL_BASED_OUTPATIENT_CLINIC_OR_DEPARTMENT_OTHER): Payer: Self-pay

## 2020-07-14 DIAGNOSIS — E119 Type 2 diabetes mellitus without complications: Secondary | ICD-10-CM | POA: Insufficient documentation

## 2020-07-14 DIAGNOSIS — I10 Essential (primary) hypertension: Secondary | ICD-10-CM | POA: Insufficient documentation

## 2020-07-14 DIAGNOSIS — M5416 Radiculopathy, lumbar region: Secondary | ICD-10-CM | POA: Insufficient documentation

## 2020-07-14 DIAGNOSIS — Z87891 Personal history of nicotine dependence: Secondary | ICD-10-CM | POA: Insufficient documentation

## 2020-07-14 DIAGNOSIS — X58XXXA Exposure to other specified factors, initial encounter: Secondary | ICD-10-CM | POA: Insufficient documentation

## 2020-07-14 DIAGNOSIS — Z7984 Long term (current) use of oral hypoglycemic drugs: Secondary | ICD-10-CM | POA: Insufficient documentation

## 2020-07-14 DIAGNOSIS — S39012A Strain of muscle, fascia and tendon of lower back, initial encounter: Secondary | ICD-10-CM | POA: Insufficient documentation

## 2020-07-14 LAB — URINALYSIS, ROUTINE W REFLEX MICROSCOPIC
Bilirubin Urine: NEGATIVE
Glucose, UA: NEGATIVE mg/dL
Hgb urine dipstick: NEGATIVE
Ketones, ur: NEGATIVE mg/dL
Leukocytes,Ua: NEGATIVE
Nitrite: NEGATIVE
Protein, ur: NEGATIVE mg/dL
Specific Gravity, Urine: 1.025 (ref 1.005–1.030)
pH: 6 (ref 5.0–8.0)

## 2020-07-14 LAB — PREGNANCY, URINE: Preg Test, Ur: NEGATIVE

## 2020-07-14 MED ORDER — METHOCARBAMOL 500 MG PO TABS: 500.0000 mg | ORAL_TABLET | Freq: Two times a day (BID) | ORAL | 0 refills | Status: DC

## 2020-07-14 MED ORDER — MELOXICAM 7.5 MG PO TABS: 7.5000 mg | ORAL_TABLET | Freq: Every day | ORAL | 0 refills | Status: DC

## 2020-07-14 MED ORDER — LIDOCAINE 5 % EX PTCH
1.0000 | MEDICATED_PATCH | CUTANEOUS | Status: DC
  Administered 2020-07-14: 1 via TRANSDERMAL
  Filled 2020-07-14: qty 1

## 2020-07-14 MED ORDER — KETOROLAC TROMETHAMINE 60 MG/2ML IM SOLN
30.0000 mg | Freq: Once | INTRAMUSCULAR | Status: AC
  Administered 2020-07-14: 30 mg via INTRAMUSCULAR
  Filled 2020-07-14: qty 2

## 2020-07-14 NOTE — ED Provider Notes (Signed)
[facility] EMERGENCY DEPARTMENT Provider Note   CSN: [id number] Arrival date & time: 07/14/20  1003     History Chief Complaint  Patient presents with  . Back Pain  . Flank Pain    Dawn Ray is a 35 y.o. female.  35 year old female with complaint of right lower back pain for the past few days without fall or injury.  Pain radiates from right lower back to right groin area.  Denies nausea, vomiting abdominal pain, change in bowel or bladder habits.  No history of prior back problems.  Patient is taking Tylenol and OTC back reliever without relief.  No other complaints or concerns today.        Past Medical History:  Diagnosis Date  . Chronic constipation   . Diabetes mellitus without complication (HCC)   . GERD (gastroesophageal reflux disease)   . High cholesterol   . Hypertension     Patient Active Problem List   Diagnosis Date Noted  . Diarrhea 04/08/2019  . Diabetes mellitus without complication (HCC) 03/25/2019  . Type 2 diabetes mellitus without complication, without long-term current use of insulin (HCC) 01/19/2019  . GAD (generalized anxiety disorder) 01/19/2019  . Abnormal pelvic ultrasound 06/21/2016  . Hyperlipidemia 06/21/2016  . Elevated ALT measurement 06/21/2016  . Pre-diabetes 06/21/2016  . Chronic hypertension 06/13/2016  . Primary amenorrhea 06/13/2016  . Obesity 06/13/2016  . Hidradenitis suppurativa 06/13/2016  . Smoker 06/13/2016  . Bowel habit changes 04/16/2016  . GERD (gastroesophageal reflux disease) 04/16/2016  . RUQ pain 10/20/2015  . Nausea without vomiting 10/20/2015  . Constipation 09/08/2015    Past Surgical History:  Procedure Laterality Date  . APPENDECTOMY    . CHOLECYSTECTOMY N/A 10/31/2015   Procedure: LAPAROSCOPIC CHOLECYSTECTOMY;  Surgeon: Ancil Linsey, MD;  Location: [facility];  Service: General;  Laterality: N/A;     OB History    Gravida  0   Para  0   Term  0   Preterm  0   AB  0    Living  0     SAB  0   IAB  0   Ectopic  0   Multiple  0   Live Births  0           Family History  Problem Relation Age of Onset  . Heart attack Paternal Grandfather   . Stomach cancer Paternal Grandmother   . Heart attack Maternal Grandmother   . Hypertension Maternal Grandmother   . Heart failure Maternal Grandmother   . Stomach cancer Maternal Grandmother   . Stroke Maternal Grandfather   . Hypertension Father   . Other Brother        died in MVA  . Colon cancer Neg Hx     Social History   Tobacco Use  . Smoking status: Former Smoker    Packs/day: 0.50    Years: 6.00    Pack years: 3.00    Types: Cigarettes    Quit date: 07/19/2018    Years since quitting: 1.9  . Smokeless tobacco: Never Used  . Tobacco comment: half pack daily  Vaping Use  . Vaping Use: Never used  Substance Use Topics  . Alcohol use: Yes    Alcohol/week: 0.0 standard drinks    Comment: occasionally  . Drug use: No    Home Medications Prior to Admission medications   Medication Sig Start Date End Date Taking? Authorizing Provider  meloxicam (MOBIC) 7.5 MG tablet Take 1  tablet (7.5 mg total) by mouth daily. 07/14/20  Yes Jeannie Fend, PA-C  methocarbamol (ROBAXIN) 500 MG tablet Take 1 tablet (500 mg total) by mouth 2 (two) times daily. 07/14/20  Yes Jeannie Fend, PA-C  albuterol (VENTOLIN HFA) 108 (90 Base) MCG/ACT inhaler Inhale 2 puffs into the lungs every 6 (six) hours as needed for wheezing or shortness of breath. 02/16/19   Janeece Agee, NP  azelastine (ASTELIN) 0.1 % nasal spray Place 1 spray into both nostrils 2 (two) times daily. Use in each nostril as directed 09/09/19   Janeece Agee, NP  clindamycin (CLINDAGEL) 1 % gel Apply topically 2 (two) times daily. Patient not taking: Reported on 09/09/2019 03/27/19   Royal Hawthorn, NP  escitalopram (LEXAPRO) 10 MG tablet escitalopram 10 mg tablet  TAKE 1 TABLET BY MOUTH EVERY DAY    [provider]   escitalopram (LEXAPRO) 20 MG tablet TAKE 1 TABLET BY MOUTH EVERY DAY 06/29/20   Janeece Agee, NP  FARXIGA 10 MG TABS tablet TAKE 1 TABLET BY MOUTH DAILY BEFORE BREAKFAST 11/10/19   Janeece Agee, NP  fluticasone (FLONASE) 50 MCG/ACT nasal spray Place 2 sprays into both nostrils daily. 02/16/19   Janeece Agee, NP  guaiFENesin-dextromethorphan (ROBITUSSIN DM) 100-10 MG/5ML syrup Take 5 mLs by mouth every 4 (four) hours as needed for cough. 09/09/19   Janeece Agee, NP  ibuprofen (ADVIL,MOTRIN) 600 MG tablet Take 1 tablet (600 mg total) by mouth 3 (three) times daily with meals as needed. 07/15/17   Loren Racer, MD  loperamide (IMODIUM) 2 MG capsule Take 1 capsule (2 mg total) by mouth as needed. For Diarrhea or loose stools. 04/08/19   Janeece Agee, NP  losartan (COZAAR) 25 MG tablet TAKE 1 TABLET BY MOUTH EVERY DAY 01/29/20   Janeece Agee, NP  metFORMIN (GLUCOPHAGE) 500 MG tablet Take 1 tablet (500 mg total) by mouth daily with breakfast. 06/27/20 07/27/20  Curatolo, Adam, DO  montelukast (SINGULAIR) 10 MG tablet Take 1 tablet (10 mg total) by mouth at bedtime. 09/09/19   Janeece Agee, NP  pantoprazole (PROTONIX) 40 MG tablet Take 1 tablet (40 mg total) by mouth daily. 01/19/19   Janeece Agee, NP  simvastatin (ZOCOR) 20 MG tablet Take 1 tablet (20 mg total) by mouth daily. 01/19/19   Janeece Agee, NP  tiZANidine (ZANAFLEX) 4 MG tablet Take 4 mg by mouth 3 (three) times daily as needed. 04/20/19   [provider]  traZODone (DESYREL) 50 MG tablet TAKE 1/2 TO 1 TABLET BY MOUTH AT BEDTIME AS NEEDED FOR SLEEP 10/12/19   Janeece Agee, NP    Allergies    Patient has no known allergies.  Review of Systems   Review of Systems  Constitutional: Negative for chills and fever.  Gastrointestinal: Negative for abdominal pain, constipation, diarrhea, nausea and vomiting.  Genitourinary: Negative for decreased urine volume, difficulty urinating and dysuria.  Musculoskeletal:  Positive for back pain.  Skin: Negative for color change, rash and wound.  Allergic/Immunologic: Positive for immunocompromised state.  Neurological: Negative for weakness and numbness.  All other systems reviewed and are negative.   Physical Exam Updated Vital Signs BP 133/84 (BP Location: Right Arm)   Pulse 85   Temp 98.5 F (36.9 C) (Oral)   Resp 16   Ht 5\' 3"  (1.6 m)   Wt 90.7 kg   SpO2 100%   BMI 35.43 kg/m   Physical Exam Vitals and nursing note reviewed.  Constitutional:      General: She  is not in acute distress.    Appearance: She is well-developed. She is not diaphoretic.  HENT:     Head: Normocephalic and atraumatic.  Cardiovascular:     Pulses: Normal pulses.  Pulmonary:     Effort: Pulmonary effort is normal.  Abdominal:     Palpations: Abdomen is soft.     Tenderness: There is no abdominal tenderness.  Musculoskeletal:        General: Tenderness present. No deformity.     Thoracic back: No tenderness or bony tenderness.     Lumbar back: Tenderness present. No bony tenderness. Positive right straight leg raise test. Negative left straight leg raise test.       Back:     Right lower leg: No edema.     Left lower leg: No edema.  Skin:    General: Skin is warm and dry.     Findings: No bruising, erythema or rash.  Neurological:     Mental Status: She is alert and oriented to person, place, and time.     Sensory: No sensory deficit.     Motor: No weakness.     Gait: Gait normal.  Psychiatric:        Behavior: Behavior normal.     ED Results / Procedures / Treatments   Labs (all labs ordered are listed, but only abnormal results are displayed) Labs Reviewed  URINALYSIS, ROUTINE W REFLEX MICROSCOPIC  PREGNANCY, URINE    EKG None  Radiology No results found.  Procedures Procedures   Medications Ordered in ED Medications  ketorolac (TORADOL) injection 30 mg (has no administration in time range)  lidocaine (LIDODERM) 5 % 1 patch (has no  administration in time range)    ED Course  I have reviewed the triage vital signs and the nursing notes.  Pertinent labs & imaging results that were available during my care of the patient were reviewed by me and considered in my medical decision making (see chart for details).  Clinical Course as of 07/14/20 1334  Thu Jul 14, 2020  3451 35 year old female with right low back pain.  On exam found a tenderness to the right lower back, no midline or bony tenderness.  Normal leg strength and sensation, flexes symmetric, gait intact.  Straight leg raise on the right produces pain on the right. Recommend warm compresses followed by gentle stretching as well as Robaxin and meloxicam.  Recheck with PCP if pain persists. [LM]    Clinical Course User Index [LM] Alden Hipp   MDM Rules/Calculators/A&P                          Final Clinical Impression(s) / ED Diagnoses Final diagnoses:  Strain of lumbar region, initial encounter  Lumbar radiculopathy    Rx / DC Orders ED Discharge Orders         Ordered    methocarbamol (ROBAXIN) 500 MG tablet  2 times daily        07/14/20 1330    meloxicam (MOBIC) 7.5 MG tablet  Daily        07/14/20 1330           Alden Hipp 07/14/20 1334    Benjiman Core, MD 07/14/20 1451

## 2020-07-14 NOTE — Discharge Instructions (Signed)
Warm compresses for 20 minutes at a time followed by gentle stretching as tolerated. Take Robaxin and meloxicam as needed as prescribed.  Follow-up with your doctor if pain persist.

## 2020-07-14 NOTE — ED Triage Notes (Addendum)
Right lower back/flank  pain radiating to lower abdomen and down into right leg x 4 days.

## 2020-09-23 ENCOUNTER — Encounter: Payer: Self-pay | Admitting: Internal Medicine

## 2020-09-23 ENCOUNTER — Other Ambulatory Visit: Payer: Self-pay

## 2020-09-23 ENCOUNTER — Ambulatory Visit: Payer: Self-pay | Attending: Internal Medicine | Admitting: Internal Medicine

## 2020-09-23 VITALS — BP 120/68 | HR 98 | Resp 16 | Ht 63.0 in | Wt 214.4 lb

## 2020-09-23 DIAGNOSIS — E1159 Type 2 diabetes mellitus with other circulatory complications: Secondary | ICD-10-CM

## 2020-09-23 DIAGNOSIS — E669 Obesity, unspecified: Secondary | ICD-10-CM

## 2020-09-23 DIAGNOSIS — E785 Hyperlipidemia, unspecified: Secondary | ICD-10-CM

## 2020-09-23 DIAGNOSIS — I152 Hypertension secondary to endocrine disorders: Secondary | ICD-10-CM

## 2020-09-23 DIAGNOSIS — J301 Allergic rhinitis due to pollen: Secondary | ICD-10-CM

## 2020-09-23 DIAGNOSIS — F17200 Nicotine dependence, unspecified, uncomplicated: Secondary | ICD-10-CM

## 2020-09-23 DIAGNOSIS — F411 Generalized anxiety disorder: Secondary | ICD-10-CM

## 2020-09-23 DIAGNOSIS — E1169 Type 2 diabetes mellitus with other specified complication: Secondary | ICD-10-CM

## 2020-09-23 DIAGNOSIS — E119 Type 2 diabetes mellitus without complications: Secondary | ICD-10-CM

## 2020-09-23 LAB — POCT GLYCOSYLATED HEMOGLOBIN (HGB A1C): HbA1c, POC (controlled diabetic range): 6.6 % (ref 0.0–7.0)

## 2020-09-23 LAB — GLUCOSE, POCT (MANUAL RESULT ENTRY): POC Glucose: 256 mg/dl — AB (ref 70–99)

## 2020-09-23 MED ORDER — ATORVASTATIN CALCIUM 10 MG PO TABS
10.0000 mg | ORAL_TABLET | Freq: Every day | ORAL | 3 refills | Status: DC
  Filled 2020-09-23: qty 30, 30d supply, fill #0

## 2020-09-23 MED ORDER — LOSARTAN POTASSIUM 25 MG PO TABS
ORAL_TABLET | ORAL | 3 refills | Status: DC
  Filled 2020-09-23: qty 30, 30d supply, fill #0
  Filled 2020-11-30 – 2020-12-28 (×3): qty 30, 30d supply, fill #1

## 2020-09-23 MED ORDER — DAPAGLIFLOZIN PROPANEDIOL 5 MG PO TABS
5.0000 mg | ORAL_TABLET | Freq: Every day | ORAL | 5 refills | Status: DC
  Filled 2020-09-23: qty 30, 30d supply, fill #0

## 2020-09-23 MED ORDER — NICOTINE 21 MG/24HR TD PT24
21.0000 mg | MEDICATED_PATCH | Freq: Every day | TRANSDERMAL | 1 refills | Status: DC
  Filled 2020-09-23: qty 28, 28d supply, fill #0

## 2020-09-23 MED ORDER — ESCITALOPRAM OXALATE 10 MG PO TABS
ORAL_TABLET | ORAL | 2 refills | Status: DC
  Filled 2020-09-23: qty 30, fill #0

## 2020-09-23 MED ORDER — FLUTICASONE PROPIONATE 50 MCG/ACT NA SUSP
1.0000 | Freq: Every day | NASAL | 2 refills | Status: DC | PRN
  Filled 2020-09-23: qty 16, 30d supply, fill #0

## 2020-09-23 MED ORDER — LORATADINE 10 MG PO TABS
10.0000 mg | ORAL_TABLET | Freq: Every day | ORAL | 1 refills | Status: AC
  Filled 2020-09-23: qty 30, 30d supply, fill #0

## 2020-09-23 MED ORDER — NICOTINE POLACRILEX 2 MG MT GUM
2.0000 mg | CHEWING_GUM | OROMUCOSAL | 0 refills | Status: DC | PRN
  Filled 2020-09-23: qty 100, fill #0

## 2020-09-23 NOTE — Progress Notes (Signed)
Patient ID: Dawn Ray, female    DOB: [birth year]  MRN: [id number]  CC: Establish care  Subjective: Dawn Ray is a 35 y.o. female who presents for new patient visit Her concerns today include:  Patient with history of DM type II, HTN, HL, obesity, tob dep, hidradenitis, GAD, GERD  Previous PCP was at [facility] clinic which has closed and she loss insurance  Patient with history of HTN, DM, HL, tobacco dependence and CAD.  Only med she currently has is Losartan  DM/Obesity: DIABETES TYPE 2 Last A1C:   Results for orders placed or performed in visit on 09/23/20  POCT glucose (manual entry)  Result Value Ref Range   POC Glucose 256 (A) 70 - 99 mg/dl  POCT glycosylated hemoglobin (Hb A1C)  Result Value Ref Range   Hemoglobin A1C     HbA1c POC (<> result, manual entry)     HbA1c, POC (prediabetic range)     HbA1c, POC (controlled diabetic range) 6.6 0.0 - 7.0 %    -Not on meds in several mths.  She did not tolerate metformin in the past because it caused GI upset.  She was on Farxiga 10 mg. Checks BS 2 x a wk before meals.  If she is late in eating, BS can drop low.  Eating habits:  She has decrease portion sizes and less sugar.  Drinks water and zero calorie drinks Walking daily 30 mins No blurred vision.  Over due for eye exam  HYPERTENSION Currently taking: see medication list.  She is on Cozaar which she has taken already for today Med Adherence: [x]  Yes    []  No Medication side effects: []  Yes    [x]  No Adherence with salt restriction: [x]  Yes    []  No Home Monitoring?: [x]  Yes    []  No Monitoring Frequency:  Not often Home BP results range: []  Yes    []  No SOB? []  Yes    [x]  No Chest Pain?: []  Yes    [x]  No Leg swelling?: []  Yes    [x]  No Headaches?: []  Yes    [x]  No Dizziness? []  Yes    [x]  No Comments:   TOb dep:  1/2 pk a day. Smoked for 10 yrs.  Stopped for 1 yr and restarted beginning of the yr due to life stresses. She stopped cold .  Ready  to give a trail of quitting again because she knows it is not good for her health and they are becoming too costly  GAD: was on Lexpro but out x 1-2 mths.  She feels that she needs to be back on the medication for sure because of increased stressors.    HL:  Off Zocor for a while  Patient's chart reviewed. Patient Active Problem List   Diagnosis Date Noted  . Diarrhea 04/08/2019  . Diabetes mellitus without complication (HCC) 03/25/2019  . Type 2 diabetes mellitus without complication, without long-term current use of insulin (HCC) 01/19/2019  . GAD (generalized anxiety disorder) 01/19/2019  . Abnormal pelvic ultrasound 06/21/2016  . Hyperlipidemia 06/21/2016  . Elevated ALT measurement 06/21/2016  . Pre-diabetes 06/21/2016  . Chronic hypertension 06/13/2016  . Primary amenorrhea 06/13/2016  . Obesity 06/13/2016  . Hidradenitis suppurativa 06/13/2016  . Smoker 06/13/2016  . Bowel habit changes 04/16/2016  . GERD (gastroesophageal reflux disease) 04/16/2016  . RUQ pain 10/20/2015  . Nausea without vomiting 10/20/2015  . Constipation 09/08/2015     Current Outpatient Medications on File Prior to Visit  Medication Sig Dispense Refill  . escitalopram (LEXAPRO) 10 MG tablet escitalopram 10 mg tablet  TAKE 1 TABLET BY MOUTH EVERY DAY    . pantoprazole (PROTONIX) 40 MG tablet Take 1 tablet (40 mg total) by mouth daily. 90 tablet 3   No current facility-administered medications on file prior to visit.    No Known Allergies  Social History   Socioeconomic History  . Marital status: Single    Spouse name: Not on file  . Number of children: 0  . Years of education: Not on file  . Highest education level: Not on file  Occupational History  . Occupation: employed  Tobacco Use  . Smoking status: Current Every Day Smoker    Packs/day: 0.50    Years: 10.00    Pack years: 5.00    Types: Cigarettes    Last attempt to quit: 07/19/2018    Years since quitting: 2.1  . Smokeless  tobacco: Never Used  . Tobacco comment: half pack daily  Vaping Use  . Vaping Use: Never used  Substance and Sexual Activity  . Alcohol use: Yes    Alcohol/week: 0.0 standard drinks    Comment: occasionally  . Drug use: No  . Sexual activity: Not Currently    Birth control/protection: None  Other Topics Concern  . Not on file  Social History Narrative  . Not on file   Social Determinants of Health   Financial Resource Strain: Not on file  Food Insecurity: Not on file  Transportation Needs: Not on file  Physical Activity: Not on file  Stress: Not on file  Social Connections: Not on file  Intimate Partner Violence: Not on file    Family History  Problem Relation Age of Onset  . Heart attack Paternal Grandfather   . Stomach cancer Paternal Grandmother   . Heart attack Maternal Grandmother   . Hypertension Maternal Grandmother   . Heart failure Maternal Grandmother   . Stomach cancer Maternal Grandmother   . Stroke Maternal Grandfather   . Hypertension Father   . Other Brother        died in MVA  . Colon cancer Neg Hx     Past Surgical History:  Procedure Laterality Date  . APPENDECTOMY    . CHOLECYSTECTOMY N/A 10/31/2015   Procedure: LAPAROSCOPIC CHOLECYSTECTOMY;  Surgeon: Ancil Linsey, MD;  Location: [facility];  Service: General;  Laterality: N/A;    ROS: Review of Systems HEENT: Complains of allergy symptoms including lacrimation, itchy eyes and throat, sneezing, drainage at the back of the throat and sinus pressure  PHYSICAL EXAM: BP 120/68   Pulse 98   Resp 16   Ht 5\' 3"  (1.6 m)   Wt 214 lb 6.4 oz (97.3 kg)   SpO2 98%   BMI 37.98 kg/m   Wt Readings from Last 3 Encounters:  09/23/20 214 lb 6.4 oz (97.3 kg)  07/14/20 200 lb (90.7 kg)  06/27/20 200 lb (90.7 kg)    Physical Exam  General appearance - alert, well appearing, and in no distress Mental status - normal mood, behavior, speech, dress, motor activity, and thought processes Eyes - pupils  equal and reactive, extraocular eye movements intact Nose - normal and patent, no erythema, discharge or polyps Mouth - mucous membranes moist, pharynx normal without lesions Neck - supple, no significant adenopathy Chest - clear to auscultation, no wheezes, rales or rhonchi, symmetric air entry Heart - normal rate, regular rhythm, normal S1, S2, no murmurs, rubs, clicks or gallops  Extremities - peripheral pulses normal, no pedal edema, no clubbing or cyanosis Diabetic Foot Exam - Simple   Simple Foot Form Visual Inspection No deformities, no ulcerations, no other skin breakdown bilaterally: Yes Sensation Testing Intact to touch and monofilament testing bilaterally: Yes Pulse Check Posterior Tibialis and Dorsalis pulse intact bilaterally: Yes Comments Mild peeling of skin around the borders of both feet     GAD 7 : Generalized Anxiety Score 09/23/2020  Nervous, Anxious, on Edge 2  Control/stop worrying 2  Worry too much - different things 2  Trouble relaxing 2  Restless 1  Easily annoyed or irritable 1  Afraid - awful might happen 0  Total GAD 7 Score 10    Depression screen [facility] 2/9 09/23/2020 09/09/2019 05/19/2019  Decreased Interest 1 0 3  Down, Depressed, Hopeless 1 0 3  PHQ - 2 Score 2 0 6  Altered sleeping 2 - 3  Tired, decreased energy 1 - 3  Change in appetite 1 - 2  Feeling bad or failure about yourself  0 - 3  Trouble concentrating 0 - 3  Moving slowly or fidgety/restless 0 - 3  Suicidal thoughts 0 - 0  PHQ-9 Score 6 - 23    CMP Latest Ref Rng & Units 06/26/2020 08/18/2019 01/19/2019  Glucose 70 - 99 mg/dL 161(W149(H) 960(A153(H) 540(J111(H)  BUN 6 - 20 mg/dL 13 10 12   Creatinine 0.44 - 1.00 mg/dL 8.110.73 9.140.76 7.820.71  Sodium 135 - 145 mmol/L 138 139 140  Potassium 3.5 - 5.1 mmol/L 3.9 4.1 5.0  Chloride 98 - 111 mmol/L 99 103 107(H)  CO2 22 - 32 mmol/L 31 24 20   Calcium 8.9 - 10.3 mg/dL 9.3 9.2 9.1  Total Protein 6.5 - 8.1 g/dL 6.9 6.9 7.0  Total Bilirubin 0.3 - 1.2 mg/dL 0.4 0.3  0.3  Alkaline Phos 38 - 126 U/L 41 48 44  AST 15 - 41 U/L 18 26 30   ALT 0 - 44 U/L 27 41(H) 44(H)   Lipid Panel     Component Value Date/Time   CHOL 154 08/18/2019 1545   TRIG 323 (H) 08/18/2019 1545   HDL 24 (L) 08/18/2019 1545   CHOLHDL 6.4 (H) 08/18/2019 1545   LDLCALC 77 08/18/2019 1545    CBC    Component Value Date/Time   WBC 9.7 06/26/2020 0247   RBC 4.69 06/26/2020 0247   HGB 14.7 06/26/2020 0247   HGB 14.2 01/19/2019 0908   HCT 42.8 06/26/2020 0247   HCT 42.8 01/19/2019 0908   PLT 255 06/26/2020 0247   PLT 251 01/19/2019 0908   MCV 91.3 06/26/2020 0247   MCV 91 01/19/2019 0908   MCH 31.3 06/26/2020 0247   MCHC 34.3 06/26/2020 0247   RDW 12.5 06/26/2020 0247   RDW 11.8 01/19/2019 0908   LYMPHSABS 2.8 01/19/2019 0908   MONOABS 0.8 07/15/2017 0833   EOSABS 0.3 01/19/2019 0908   BASOSABS 0.1 01/19/2019 0908    ASSESSMENT AND PLAN: 1. Type 2 diabetes mellitus with obesity (HCC) A1c is good. Advised patient to check blood sugars at least once a day alternating before breakfast and before dinner.  I will restart Marcelline DeistFarxiga but at a very low dose of 5 mg a day.  Advised patient to stay hydrated while on this medication.  Dietary counseling given.  Continue regular exercise -Advised to get an eye exam done at least once a year.  Gave the name of places where she can get an eye exam done at reasonable cost. -  POCT glucose (manual entry) - POCT glycosylated hemoglobin (Hb A1C) - Microalbumin / creatinine urine ratio - Lipid panel - dapagliflozin propanediol (FARXIGA) 5 MG TABS tablet; Take 1 tablet (5 mg total) by mouth daily before breakfast.  Dispense: 30 tablet; Refill: 5  2. Hypertension associated with diabetes (HCC) At goal.  Continue Cozaar and low-salt diet - losartan (COZAAR) 25 MG tablet; TAKE 1 TABLET BY MOUTH EVERY DAY  Dispense: 90 tablet; Refill: 3  3. Tobacco dependence Pt is current smoker. Patient advised to quit smoking. Discussed health risks  associated with smoking including lung and other types of cancers, chronic lung diseases and CV risks.. Pt ready to give trail of quitting.   Discussed methods to help quit including quitting cold [place], use of NRT, Chantix and Bupropion.  Pt wanting to try: Nicotine patches.  We discussed the stepdown approach.  We will start with the 21 mg patch and step her down each month _3_ Minutes spent on counseling. F/U: Readdress on follow-up visit in 6 weeks  - nicotine (NICODERM CQ - DOSED IN MG/24 HOURS) 21 mg/24hr patch; Place 1 patch (21 mg total) onto the skin daily.  Dispense: 28 patch; Refill: 1 - nicotine polacrilex (NICORETTE) 2 MG gum; Take 1 each (2 mg total) by mouth as needed for smoking cessation.  Dispense: 100 tablet; Refill: 0  4. GAD (generalized anxiety disorder) Refill given on Lexapro  5. Hyperlipidemia associated with type 2 diabetes mellitus (HCC) - atorvastatin (LIPITOR) 10 MG tablet; Take 1 tablet (10 mg total) by mouth daily.  Dispense: 90 tablet; Refill: 3  6. Seasonal allergic rhinitis due to pollen - loratadine (CLARITIN) 10 MG tablet; Take 1 tablet (10 mg total) by mouth daily.  Dispense: 30 tablet; Refill: 1 - fluticasone (FLONASE) 50 MCG/ACT nasal spray; Place 1 spray into both nostrils daily as needed for allergies or rhinitis.  Dispense: 16 g; Refill: 2     Patient was given the opportunity to ask questions.  Patient verbalized understanding of the plan and was able to repeat key elements of the plan.   Orders Placed This Encounter  Procedures  . Microalbumin / creatinine urine ratio  . Lipid panel  . POCT glucose (manual entry)  . POCT glycosylated hemoglobin (Hb A1C)     Requested Prescriptions   Signed Prescriptions Disp Refills  . dapagliflozin propanediol (FARXIGA) 5 MG TABS tablet 30 tablet 5    Sig: Take 1 tablet (5 mg total) by mouth daily before breakfast.  . losartan (COZAAR) 25 MG tablet 90 tablet 3    Sig: TAKE 1 TABLET BY MOUTH EVERY  DAY  . atorvastatin (LIPITOR) 10 MG tablet 90 tablet 3    Sig: Take 1 tablet (10 mg total) by mouth daily.  Marland Kitchen loratadine (CLARITIN) 10 MG tablet 30 tablet 1    Sig: Take 1 tablet (10 mg total) by mouth daily.  . fluticasone (FLONASE) 50 MCG/ACT nasal spray 16 g 2    Sig: Place 1 spray into both nostrils daily as needed for allergies or rhinitis.  Marland Kitchen nicotine (NICODERM CQ - DOSED IN MG/24 HOURS) 21 mg/24hr patch 28 patch 1    Sig: Place 1 patch (21 mg total) onto the skin daily.  . nicotine polacrilex (NICORETTE) 2 MG gum 100 tablet 0    Sig: Take 1 each (2 mg total) by mouth as needed for smoking cessation.    Return in about 6 weeks (around 11/04/2020) for PAP.  Jonah Blue, MD, FACP

## 2020-09-23 NOTE — Patient Instructions (Signed)
Try to get your eye exam done before the end of the year.  It is recommended that people with diabetes have an eye exam at least once a year.  Check your blood sugars at least once a day before meals.  The goal for blood sugars before meals is 90-130.  If you check it 2 hours after meal, the goal is to be less than 180.  Diabetes Mellitus and Nutrition, Adult When you have diabetes, or diabetes mellitus, it is very important to have healthy eating habits because your blood sugar (glucose) levels are greatly affected by what you eat and drink. Eating healthy foods in the right amounts, at about the same times every day, can help you:  Control your blood glucose.  Lower your risk of heart disease.  Improve your blood pressure.  Reach or maintain a healthy weight. What can affect my meal plan? Every person with diabetes is different, and each person has different needs for a meal plan. Your health care provider may recommend that you work with a dietitian to make a meal plan that is best for you. Your meal plan may vary depending on factors such as:  The calories you need.  The medicines you take.  Your weight.  Your blood glucose, blood pressure, and cholesterol levels.  Your activity level.  Other health conditions you have, such as heart or kidney disease. How do carbohydrates affect me? Carbohydrates, also called carbs, affect your blood glucose level more than any other type of food. Eating carbs naturally raises the amount of glucose in your blood. Carb counting is a method for keeping track of how many carbs you eat. Counting carbs is important to keep your blood glucose at a healthy level, especially if you use insulin or take certain oral diabetes medicines. It is important to know how many carbs you can safely have in each meal. This is different for every person. Your dietitian can help you calculate how many carbs you should have at each meal and for each snack. How does  alcohol affect me? Alcohol can cause a sudden decrease in blood glucose (hypoglycemia), especially if you use insulin or take certain oral diabetes medicines. Hypoglycemia can be a life-threatening condition. Symptoms of hypoglycemia, such as sleepiness, dizziness, and confusion, are similar to symptoms of having too much alcohol.  Do not drink alcohol if: ? Your health care provider tells you not to drink. ? You are pregnant, may be pregnant, or are planning to become pregnant.  If you drink alcohol: ? Do not drink on an empty stomach. ? Limit how much you use to:  0-1 drink a day for women.  0-2 drinks a day for men. ? Be aware of how much alcohol is in your drink. In the [place] one drink equals one 12 oz bottle of beer (355 mL), one 5 oz glass of wine (148 mL), or one 1 oz glass of hard liquor (44 mL). ? Keep yourself hydrated with water, diet soda, or unsweetened iced tea.  Keep in mind that regular soda, juice, and other mixers may contain a lot of sugar and must be counted as carbs. What are tips for following this plan? Reading food labels  Start by checking the serving size on the "Nutrition Facts" label of packaged foods and drinks. The amount of calories, carbs, fats, and other nutrients listed on the label is based on one serving of the item. Many items contain more than one serving per package.  Check  the total grams (g) of carbs in one serving. You can calculate the number of servings of carbs in one serving by dividing the total carbs by 15. For example, if a food has 30 g of total carbs per serving, it would be equal to 2 servings of carbs.  Check the number of grams (g) of saturated fats and trans fats in one serving. Choose foods that have a low amount or none of these fats.  Check the number of milligrams (mg) of salt (sodium) in one serving. Most people should limit total sodium intake to less than 2,300 mg per day.  Always check the nutrition information of foods  labeled as "low-fat" or "nonfat." These foods may be higher in added sugar or refined carbs and should be avoided.  Talk to your dietitian to identify your daily goals for nutrients listed on the label. Shopping  Avoid buying canned, pre-made, or processed foods. These foods tend to be high in fat, sodium, and added sugar.  Shop around the outside edge of the grocery store. This is where you will most often find fresh fruits and vegetables, bulk grains, fresh meats, and fresh dairy. Cooking  Use low-heat cooking methods, such as baking, instead of high-heat cooking methods like deep frying.  Cook using healthy oils, such as olive, canola, or sunflower oil.  Avoid cooking with butter, cream, or high-fat meats. Meal planning  Eat meals and snacks regularly, preferably at the same times every day. Avoid going long periods of time without eating.  Eat foods that are high in fiber, such as fresh fruits, vegetables, beans, and whole grains. Talk with your dietitian about how many servings of carbs you can eat at each meal.  Eat 4-6 oz (112-168 g) of lean protein each day, such as lean meat, chicken, fish, eggs, or tofu. One ounce (oz) of lean protein is equal to: ? 1 oz (28 g) of meat, chicken, or fish. ? 1 egg. ?  cup (62 g) of tofu.  Eat some foods each day that contain healthy fats, such as avocado, nuts, seeds, and fish.   What foods should I eat? Fruits Berries. Apples. Oranges. Peaches. Apricots. Plums. Grapes. Mango. Papaya. Pomegranate. Kiwi. Cherries. Vegetables Lettuce. Spinach. Leafy greens, including kale, chard, collard greens, and mustard greens. Beets. Cauliflower. Cabbage. Broccoli. Carrots. Green beans. Tomatoes. Peppers. Onions. Cucumbers. Brussels sprouts. Grains Whole grains, such as whole-wheat or whole-grain bread, crackers, tortillas, cereal, and pasta. Unsweetened oatmeal. Quinoa. Brown or wild rice. Meats and other proteins Seafood. Poultry without skin. Lean  cuts of poultry and beef. Tofu. Nuts. Seeds. Dairy Low-fat or fat-free dairy products such as milk, yogurt, and cheese. The items listed above may not be a complete list of foods and beverages you can eat. Contact a dietitian for more information. What foods should I avoid? Fruits Fruits canned with syrup. Vegetables Canned vegetables. Frozen vegetables with butter or cream sauce. Grains Refined white flour and flour products such as bread, pasta, snack foods, and cereals. Avoid all processed foods. Meats and other proteins Fatty cuts of meat. Poultry with skin. Breaded or fried meats. Processed meat. Avoid saturated fats. Dairy Full-fat yogurt, cheese, or milk. Beverages Sweetened drinks, such as soda or iced tea. The items listed above may not be a complete list of foods and beverages you should avoid. Contact a dietitian for more information. Questions to ask a health care provider  Do I need to meet with a diabetes educator?  Do I need to meet  with a dietitian?  What number can I call if I have questions?  When are the best times to check my blood glucose? Where to find more information:  American Diabetes Association: diabetes.org  Academy of Nutrition and Dietetics: www.eatright.[employer] of Diabetes and Digestive and Kidney Diseases: CarFlippers.tn  Association of Diabetes Care and Education Specialists: www.diabeteseducator.org Summary  It is important to have healthy eating habits because your blood sugar (glucose) levels are greatly affected by what you eat and drink.  A healthy meal plan will help you control your blood glucose and maintain a healthy lifestyle.  Your health care provider may recommend that you work with a dietitian to make a meal plan that is best for you.  Keep in mind that carbohydrates (carbs) and alcohol have immediate effects on your blood glucose levels. It is important to count carbs and to use alcohol carefully. This  information is not intended to replace advice given to you by your health care provider. Make sure you discuss any questions you have with your health care provider. Document Revised: 03/31/2019 Document Reviewed: 03/31/2019 Elsevier Patient Education  2021 [employer].

## 2020-09-24 LAB — MICROALBUMIN / CREATININE URINE RATIO
Creatinine, Urine: 126 mg/dL
Microalb/Creat Ratio: 4 mg/g creat (ref 0–29)
Microalbumin, Urine: 5.2 ug/mL

## 2020-09-24 LAB — LIPID PANEL
Chol/HDL Ratio: 9.2 ratio — ABNORMAL HIGH (ref 0.0–4.4)
Cholesterol, Total: 221 mg/dL — ABNORMAL HIGH (ref 100–199)
HDL: 24 mg/dL — ABNORMAL LOW (ref >39)
LDL Chol Calc (NIH): 109 mg/dL — ABNORMAL HIGH (ref 0–99)
Triglycerides: 508 mg/dL — ABNORMAL HIGH (ref 0–149)
VLDL Cholesterol Cal: 88 mg/dL — ABNORMAL HIGH (ref 5–40)

## 2020-09-25 ENCOUNTER — Encounter (HOSPITAL_COMMUNITY): Payer: Self-pay | Admitting: Emergency Medicine

## 2020-09-25 ENCOUNTER — Emergency Department (HOSPITAL_COMMUNITY)
Admission: EM | Admit: 2020-09-25 | Discharge: 2020-09-25 | Disposition: A | Discharge status: Home / Self Care | Payer: Self-pay | Attending: Emergency Medicine | Admitting: Emergency Medicine

## 2020-09-25 ENCOUNTER — Emergency Department (HOSPITAL_COMMUNITY): Payer: Self-pay

## 2020-09-25 DIAGNOSIS — R42 Dizziness and giddiness: Secondary | ICD-10-CM | POA: Insufficient documentation

## 2020-09-25 DIAGNOSIS — Z7952 Long term (current) use of systemic steroids: Secondary | ICD-10-CM | POA: Insufficient documentation

## 2020-09-25 DIAGNOSIS — R202 Paresthesia of skin: Secondary | ICD-10-CM | POA: Insufficient documentation

## 2020-09-25 DIAGNOSIS — Z7984 Long term (current) use of oral hypoglycemic drugs: Secondary | ICD-10-CM | POA: Insufficient documentation

## 2020-09-25 DIAGNOSIS — F419 Anxiety disorder, unspecified: Secondary | ICD-10-CM | POA: Insufficient documentation

## 2020-09-25 DIAGNOSIS — R110 Nausea: Secondary | ICD-10-CM | POA: Insufficient documentation

## 2020-09-25 DIAGNOSIS — F17210 Nicotine dependence, cigarettes, uncomplicated: Secondary | ICD-10-CM | POA: Insufficient documentation

## 2020-09-25 DIAGNOSIS — R519 Headache, unspecified: Secondary | ICD-10-CM | POA: Insufficient documentation

## 2020-09-25 DIAGNOSIS — I10 Essential (primary) hypertension: Secondary | ICD-10-CM | POA: Insufficient documentation

## 2020-09-25 DIAGNOSIS — Z79899 Other long term (current) drug therapy: Secondary | ICD-10-CM | POA: Insufficient documentation

## 2020-09-25 DIAGNOSIS — F411 Generalized anxiety disorder: Secondary | ICD-10-CM

## 2020-09-25 DIAGNOSIS — E119 Type 2 diabetes mellitus without complications: Secondary | ICD-10-CM | POA: Insufficient documentation

## 2020-09-25 LAB — CBC WITH DIFFERENTIAL/PLATELET
Abs Immature Granulocytes: 0.05 10*3/uL (ref 0.00–0.07)
Basophils Absolute: 0.1 10*3/uL (ref 0.0–0.1)
Basophils Relative: 1 %
Eosinophils Absolute: 0.3 10*3/uL (ref 0.0–0.5)
Eosinophils Relative: 3 %
HCT: 44.4 % (ref 36.0–46.0)
Hemoglobin: 15.1 g/dL — ABNORMAL HIGH (ref 12.0–15.0)
Immature Granulocytes: 1 %
Lymphocytes Relative: 28 %
Lymphs Abs: 3 10*3/uL (ref 0.7–4.0)
MCH: 30.8 pg (ref 26.0–34.0)
MCHC: 34 g/dL (ref 30.0–36.0)
MCV: 90.6 fL (ref 80.0–100.0)
Monocytes Absolute: 0.7 10*3/uL (ref 0.1–1.0)
Monocytes Relative: 6 %
Neutro Abs: 6.9 10*3/uL (ref 1.7–7.7)
Neutrophils Relative %: 61 %
Platelets: 273 10*3/uL (ref 150–400)
RBC: 4.9 MIL/uL (ref 3.87–5.11)
RDW: 12.5 % (ref 11.5–15.5)
WBC: 11.1 10*3/uL — ABNORMAL HIGH (ref 4.0–10.5)
nRBC: 0 % (ref 0.0–0.2)

## 2020-09-25 LAB — COMPREHENSIVE METABOLIC PANEL
ALT: 49 U/L — ABNORMAL HIGH (ref 0–44)
AST: 28 U/L (ref 15–41)
Albumin: 4.8 g/dL (ref 3.5–5.0)
Alkaline Phosphatase: 50 U/L (ref 38–126)
Anion gap: 11 (ref 5–15)
BUN: 13 mg/dL (ref 6–20)
CO2: 26 mmol/L (ref 22–32)
Calcium: 9.4 mg/dL (ref 8.9–10.3)
Chloride: 102 mmol/L (ref 98–111)
Creatinine, Ser: 0.84 mg/dL (ref 0.44–1.00)
GFR, Estimated: >60 mL/min (ref >60)
Glucose, Bld: 126 mg/dL — ABNORMAL HIGH (ref 70–99)
Potassium: 4.1 mmol/L (ref 3.5–5.1)
Sodium: 139 mmol/L (ref 135–145)
Total Bilirubin: 0.7 mg/dL (ref 0.3–1.2)
Total Protein: 8.3 g/dL — ABNORMAL HIGH (ref 6.5–8.1)

## 2020-09-25 LAB — I-STAT BETA HCG BLOOD, ED (MC, WL, AP ONLY): I-stat hCG, quantitative: 13 m[IU]/mL — ABNORMAL HIGH (ref <5)

## 2020-09-25 LAB — HCG, QUANTITATIVE, PREGNANCY: hCG, Beta Chain, Quant, S: <1 m[IU]/mL (ref <5)

## 2020-09-25 LAB — CBG MONITORING, ED: Glucose-Capillary: 130 mg/dL — ABNORMAL HIGH (ref 70–99)

## 2020-09-25 MED ORDER — ESCITALOPRAM OXALATE 10 MG PO TABS: ORAL_TABLET | ORAL | 2 refills | Status: DC

## 2020-09-25 MED ORDER — KETOROLAC TROMETHAMINE 30 MG/ML IJ SOLN
15.0000 mg | Freq: Once | INTRAMUSCULAR | Status: AC
  Administered 2020-09-25: 15 mg via INTRAVENOUS
  Filled 2020-09-25: qty 1

## 2020-09-25 NOTE — ED Provider Notes (Signed)
[facility]-EMERGENCY DEPT Provider Note   CSN: [id number] Arrival date & [id number]: 09/25/20  1109     History Chief Complaint  Patient presents with  . Nausea  . Headache    Dawn Ray is a 35 y.o. female.  HPI She presents for evaluation of dizziness, nausea, tingling in her left face and left elbow, which started 2 days ago.  Later, she had onset of headache, today.  She denies trauma.  She is not having fever.  There is been no cough, shortness of breath, dysuria, urinary frequency or urgency.  There are no other known modifying factors.    Past Medical History:  Diagnosis Date  . Chronic constipation   . Diabetes mellitus without complication (HCC)   . GERD (gastroesophageal reflux disease)   . High cholesterol   . Hypertension     Patient Active Problem List   Diagnosis Date Noted  . Hyperlipidemia associated with type 2 diabetes mellitus (HCC) 09/23/2020  . Seasonal allergic rhinitis due to pollen 09/23/2020  . Diarrhea 04/08/2019  . Diabetes mellitus without complication (HCC) 03/25/2019  . Type 2 diabetes mellitus without complication, without long-term current use of insulin (HCC) 01/19/2019  . GAD (generalized anxiety disorder) 01/19/2019  . Abnormal pelvic ultrasound 06/21/2016  . Hyperlipidemia 06/21/2016  . Elevated ALT measurement 06/21/2016  . Pre-diabetes 06/21/2016  . Hypertension associated with diabetes (HCC) 06/13/2016  . Primary amenorrhea 06/13/2016  . Obesity 06/13/2016  . Hidradenitis suppurativa 06/13/2016  . Tobacco dependence 06/13/2016  . Bowel habit changes 04/16/2016  . GERD (gastroesophageal reflux disease) 04/16/2016  . RUQ pain 10/20/2015  . Nausea without vomiting 10/20/2015  . Constipation 09/08/2015    Past Surgical History:  Procedure Laterality Date  . APPENDECTOMY    . CHOLECYSTECTOMY N/A 10/31/2015   Procedure: LAPAROSCOPIC CHOLECYSTECTOMY;  Surgeon: Ancil LinseyJason Evan Davis, MD;  Location: [facility];   Service: General;  Laterality: N/A;     OB History    Gravida  0   Para  0   Term  0   Preterm  0   AB  0   Living  0     SAB  0   IAB  0   Ectopic  0   Multiple  0   Live Births  0           Family History  Problem Relation Age of Onset  . Heart attack Paternal Grandfather   . Stomach cancer Paternal Grandmother   . Heart attack Maternal Grandmother   . Hypertension Maternal Grandmother   . Heart failure Maternal Grandmother   . Stomach cancer Maternal Grandmother   . Stroke Maternal Grandfather   . Hypertension Father   . Other Brother        died in MVA  . Colon cancer Neg Hx     Social History   Tobacco Use  . Smoking status: Current Every Day Smoker    Packs/day: 0.50    Years: 10.00    Pack years: 5.00    Types: Cigarettes    Last attempt to quit: 07/19/2018    Years since quitting: 2.1  . Smokeless tobacco: Never Used  . Tobacco comment: half pack daily  Vaping Use  . Vaping Use: Never used  Substance Use Topics  . Alcohol use: Yes    Alcohol/week: 0.0 standard drinks    Comment: occasionally  . Drug use: No    Home Medications Prior to Admission medications   Medication Sig  Start Date End Date Taking? Authorizing Provider  atorvastatin (LIPITOR) 10 MG tablet Take 1 tablet (10 mg total) by mouth daily. 09/23/20   Marcine Matar, MD  dapagliflozin propanediol (FARXIGA) 5 MG TABS tablet Take 1 tablet (5 mg total) by mouth daily before breakfast. 09/23/20   Marcine Matar, MD  escitalopram (LEXAPRO) 10 MG tablet escitalopram 10 mg tablet  TAKE 1 TABLET BY MOUTH EVERY DAY 09/25/20   Mancel Bale, MD  fluticasone [facility]) 50 MCG/ACT nasal spray Place 1 spray into both nostrils daily as needed for allergies or rhinitis. 09/23/20   Marcine Matar, MD  loratadine (CLARITIN) 10 MG tablet Take 1 tablet (10 mg total) by mouth daily. 09/23/20   Marcine Matar, MD  losartan (COZAAR) 25 MG tablet TAKE 1 TABLET BY MOUTH EVERY DAY  09/23/20   Marcine Matar, MD  nicotine (NICODERM CQ - DOSED IN MG/24 HOURS) 21 mg/24hr patch Place 1 patch (21 mg total) onto the skin daily. 09/23/20   Marcine Matar, MD  nicotine polacrilex (NICORETTE) 2 MG gum Take 1 each (2 mg total) by mouth as needed for smoking cessation. 09/23/20   Marcine Matar, MD  pantoprazole (PROTONIX) 40 MG tablet Take 1 tablet (40 mg total) by mouth daily. 01/19/19   Janeece Agee, NP    Allergies    Patient has no known allergies.  Review of Systems   Review of Systems  All other systems reviewed and are negative.   Physical Exam Updated Vital Signs BP 132/81   Pulse 90   Temp 99.6 F (37.6 C) (Oral)   Resp 12   Ht 5\' 3"  (1.6 m)   Wt 95.3 kg   SpO2 100%   BMI 37.20 kg/m   Physical Exam Vitals and nursing note reviewed.  Constitutional:      General: She is not in acute distress.    Appearance: She is well-developed. She is obese. She is not ill-appearing, toxic-appearing or diaphoretic.  HENT:     Head: Normocephalic and atraumatic.     Right Ear: External ear normal.     Left Ear: External ear normal.  Eyes:     Conjunctiva/sclera: Conjunctivae normal.     Pupils: Pupils are equal, round, and reactive to light.  Neck:     Trachea: Phonation normal.  Cardiovascular:     Rate and Rhythm: Normal rate and regular rhythm.     Heart sounds: Normal heart sounds.  Pulmonary:     Effort: Pulmonary effort is normal.     Breath sounds: Normal breath sounds.  Abdominal:     General: There is no distension.     Palpations: Abdomen is soft.  Musculoskeletal:        General: Normal range of motion.     Cervical back: Normal range of motion and neck supple.  Skin:    General: Skin is warm and dry.  Neurological:     Mental Status: She is alert and oriented to person, place, and time.     Cranial Nerves: No cranial nerve deficit.     Motor: No abnormal muscle tone.     Coordination: Coordination normal.     Comments: No  dysarthria, aphasia or nystagmus.  No pronator drift.  No ataxia.  Psychiatric:        Mood and Affect: Mood normal.        Behavior: Behavior normal.        Thought Content: Thought content normal.  Judgment: Judgment normal.     ED Results / Procedures / Treatments   Labs (all labs ordered are listed, but only abnormal results are displayed) Labs Reviewed  COMPREHENSIVE METABOLIC PANEL - Abnormal; Notable for the following components:      Result Value   Glucose, Bld 126 (*)    Total Protein 8.3 (*)    ALT 49 (*)    All other components within normal limits  CBC WITH DIFFERENTIAL/PLATELET - Abnormal; Notable for the following components:   WBC 11.1 (*)    Hemoglobin 15.1 (*)    All other components within normal limits  CBG MONITORING, ED - Abnormal; Notable for the following components:   Glucose-Capillary 130 (*)    All other components within normal limits  I-STAT BETA HCG BLOOD, ED ([facility], [facility], [facility] ONLY) - Abnormal; Notable for the following components:   I-stat hCG, quantitative 13.0 (*)    All other components within normal limits  HCG, QUANTITATIVE, PREGNANCY    EKG None  Radiology CT Head Wo Contrast  Result Date: 09/25/2020 CLINICAL DATA:  35 year old female with transient LEFT-sided numbness with headache, dizziness and blurred vision today. EXAM: CT HEAD WITHOUT CONTRAST TECHNIQUE: Contiguous axial images were obtained from the base of the skull through the vertex without intravenous contrast. COMPARISON:  06/28/2010 CT FINDINGS: Brain: No evidence of acute infarction, hemorrhage, hydrocephalus, extra-axial collection or mass lesion/mass effect. Vascular: No hyperdense vessel or unexpected calcification. Skull: Normal. Negative for fracture or focal lesion. Sinuses/Orbits: No acute finding. Other: None. IMPRESSION: Unremarkable noncontrast head CT. Electronically Signed   By: Harmon Pier M.D.   On: 09/25/2020 12:10    Procedures Procedures   Medications  Ordered in ED Medications  ketorolac (TORADOL) 30 MG/ML injection 15 mg (15 mg Intravenous Given 09/25/20 1458)    ED Course  I have reviewed the triage vital signs and the nursing notes.  Pertinent labs & imaging results that were available during my care of the patient were reviewed by me and considered in my medical decision making (see chart for details).    MDM Rules/Calculators/A&P                           Patient Vitals for the past 24 hrs:  BP Temp Temp src Pulse Resp SpO2 Height Weight  09/25/20 1434 132/81 -- -- 90 12 100 % -- --  09/25/20 1121 (!) 154/93 99.6 F (37.6 C) Oral 93 16 100 % 5\' 3"  (1.6 m) 95.3 kg    3:54 PM Reevaluation with update and discussion. After initial assessment and treatment, an updated evaluation reveals she states her headache is better at this time.  She now states that she stopped taking Lexapro a month ago, "cold ," because she thought she was better.  She is interested in getting back on it.  I discussed with patient and all questions were answered. [place]   Medical Decision Making:  This patient is presenting for evaluation of dizziness, nausea, headache, paresthesia, which does require a range of treatment options, and is a complaint that involves a moderate risk of morbidity and mortality. The differential diagnoses include migraine headache, CVA, hypertensive urgency. I decided to review old records, and in summary Young adult female presenting for evaluation of multiple symptoms.  I did not require additional historical information from Anyone.  Clinical Laboratory Tests Ordered, included CBC, Metabolic panel and I-STAT hCG. Review indicates normal except elevated i-STAT hCG, white count  high, glucose high. Radiologic Tests Ordered, included CT head.  I independently Visualized: Radiograph images, which show no acute abnormality   Critical Interventions-clinical evaluation, laboratory testing, CT imaging, medication  treatment, observation and reassess  After These Interventions, the Patient was reevaluated and was found stable for discharge.  She has nonspecific symptoms on arrival, and serial evaluations are reassuring.  Possible migraines.  Possible panic or anxiety contributing.  She follows closely with her PCP.  I sent a prescription for Lexapro, because she desires to get started back on that.  CRITICAL CARE-no Performed by: Mancel Bale  Nursing Notes Reviewed/ Care Coordinated Applicable Imaging Reviewed Interpretation of Laboratory Data incorporated into ED treatment  The patient appears reasonably screened and/or stabilized for discharge and I doubt any other medical condition or other [facility] requiring further screening, evaluation, or treatment in the ED at this time prior to discharge.  Plan: Home Medications-continue usual; Home Treatments-rest, fluids; return here if the recommended treatment, does not improve the symptoms; Recommended follow up-PCP, as needed     Final Clinical Impression(s) / ED Diagnoses Final diagnoses:  Nonintractable headache, unspecified chronicity pattern, unspecified headache type  Anxiety    Rx / DC Orders ED Discharge Orders         Ordered    escitalopram (LEXAPRO) 10 MG tablet        09/25/20 1553           Mancel Bale, MD 09/25/20 1559

## 2020-09-25 NOTE — ED Triage Notes (Signed)
Patient here via EMS reporting tingling to left arm and face. Headache dizziness and nausea that started yest.

## 2020-09-25 NOTE — Discharge Instructions (Signed)
Take your medicines as directed.  Make sure you are getting plenty of rest and drink a lot of fluids.  Return here, if needed.

## 2020-09-25 NOTE — ED Provider Notes (Signed)
Emergency Medicine Provider Triage Evaluation Note  Dawn Ray , a 35 y.o. female  was evaluated in triage.  Pt complains of nausea, headache.  Patient states that she was at work when this started, then she states that her left face and left arm became numb, lasted about 10 minutes.  Patient still has headache, numbness has resolved.  Denies any history of stroke, is not on a blood thinner.  Denies any numbness, weakness, gait disturbance, speech disturbance, neglect, facial droop currently.  Numbness and tingling of left arm and face have completely resolved.  Headache gradual in onset.  Review of Systems  Positive: Headache, nausea, dizziness Negative: Facial droop, neglect, speech disturbance.  Physical Exam  BP (!) 154/93 (BP Location: Right Arm)   Pulse 93   Temp 99.6 F (37.6 C) (Oral)   Resp 16   Ht 5\' 3"  (1.6 m)   Wt 95.3 kg   SpO2 100%   BMI 37.20 kg/m  Gen:   Awake, no distress   Resp:  Normal effort  MSK:   Moves extremities without difficulty  Neuro:  Alert. Clear speech. No facial droop. CNIII-XII grossly intact. Bilateral upper and lower extremities' sensation grossly intact. 5/5 symmetric strength with grip strength and with plantar and dorsi flexion bilaterally. Pegative pronator drift.   Medical Decision Making  Medically screening exam initiated at 11:36 AM.  Appropriate orders placed.  Dawn Ray was informed that the remainder of the evaluation will be completed by another provider, this initial triage assessment does not replace that evaluation, and the importance of remaining in the ED until their evaluation is complete.    Carver Fila, PA-C 09/25/20 1137    09/27/20, MD 09/25/20 [phone]

## 2020-09-25 NOTE — ED Notes (Signed)
I called patient in the lobby to collect labs and no one responded

## 2020-09-26 ENCOUNTER — Telehealth: Payer: Self-pay

## 2020-09-26 ENCOUNTER — Other Ambulatory Visit: Payer: Self-pay

## 2020-09-26 NOTE — Telephone Encounter (Signed)
Contacted pt to go over lab results pt is aware and doesn't have any questions or concerns 

## 2020-11-04 ENCOUNTER — Encounter: Payer: Self-pay | Admitting: Internal Medicine

## 2020-11-30 ENCOUNTER — Other Ambulatory Visit: Payer: Self-pay

## 2020-12-07 ENCOUNTER — Other Ambulatory Visit: Payer: Self-pay

## 2020-12-08 ENCOUNTER — Other Ambulatory Visit: Payer: Self-pay

## 2020-12-16 ENCOUNTER — Other Ambulatory Visit: Payer: Self-pay

## 2020-12-28 ENCOUNTER — Other Ambulatory Visit: Payer: Self-pay

## 2021-07-07 ENCOUNTER — Other Ambulatory Visit: Payer: Self-pay

## 2021-07-07 ENCOUNTER — Other Ambulatory Visit: Payer: Self-pay | Admitting: Internal Medicine

## 2021-07-07 DIAGNOSIS — E1169 Type 2 diabetes mellitus with other specified complication: Secondary | ICD-10-CM

## 2021-07-07 DIAGNOSIS — E669 Obesity, unspecified: Secondary | ICD-10-CM

## 2021-07-07 MED ORDER — DAPAGLIFLOZIN PROPANEDIOL 5 MG PO TABS
5.0000 mg | ORAL_TABLET | Freq: Every day | ORAL | 0 refills | Status: DC
  Filled 2021-07-07: qty 30, 30d supply, fill #0

## 2021-07-07 NOTE — Telephone Encounter (Signed)
Courtesy refill. Last labs 09/23/20 future visit 09/21/20.  ?Requested Prescriptions  ?Pending Prescriptions Disp Refills  ?? dapagliflozin propanediol (FARXIGA) 5 MG TABS tablet 76 tablet 0  ?  Sig: Take 1 tablet (5 mg total) by mouth daily before breakfast.  ?  ? Endocrinology:  Diabetes - SGLT2 Inhibitors Failed - 07/07/2021  9:51 AM  ?  ?  Failed - HBA1C is between 0 and 7.9 and within 180 days  ?  HbA1c, POC (controlled diabetic range)  ?Date Value Ref Range Status  ?09/23/2020 6.6 0.0 - 7.0 % Final  ?   ?  ?  Failed - Valid encounter within last 6 months  ?  Recent Outpatient Visits   ?      ? 9 months ago Type 2 diabetes mellitus with obesity ([place])  ? [place] Ladell Pier, MD  ? 1 year ago Acute upper respiratory infection  ? [facility], [place], NP  ? 1 year ago Type 2 diabetes mellitus without complication, without long-term current use of insulin ([place])  ? [facility], Delfino Lovett, NP  ? 2 years ago Type 2 diabetes mellitus without complication, without long-term current use of insulin ([place])  ? [facility] [place], NP  ? 2 years ago Diarrhea, unspecified type  ? [facility], Delfino Lovett, NP  ?  ?  ?Future Appointments   ?        ? In 2 months Ladell Pier, MD [place]  ?  ? ?  ?  ?  Passed - Cr in normal range and within 360 days  ?  Creatinine, Ser  ?Date Value Ref Range Status  ?09/25/2020 0.84 0.44 - 1.00 mg/dL Final  ?   ?  ?  Passed - eGFR in normal range and within 360 days  ?  GFR calc Af Amer  ?Date Value Ref Range Status  ?08/18/2019 118 >59 mL/min/1.73 Final  ? ?GFR, Estimated  ?Date Value Ref Range Status  ?09/25/2020 >60 >60 mL/min Final  ?  Comment:  ?  (NOTE) ?Calculated using the CKD-EPI Creatinine Equation (2021) ?  ?   ?  ?  ? ?

## 2021-07-07 NOTE — Telephone Encounter (Signed)
Medication Refill - Medication: dapagliflozin propanediol (FARXIGA) 5 MG TABS tablet  ? ? Pt stated she is completely out of Medication and is asking for courtesy refill until her appointment 09/21/2021.  ? ?Has the patient contacted their pharmacy? No. ? ?(Agent: If no, request that the patient contact the pharmacy for the refill. If patient does not wish to contact the pharmacy document the reason why and proceed with request.) ? ? ?Preferred Pharmacy (with phone number or street name):  ?[facility]  ?[address], [address] [place] [address]  ?Phone: [phone] Fax: [phone]  ?Hours: M-F 7:30a-6:00p  ? ?Has the patient been seen for an appointment in the last year OR does the patient have an upcoming appointment? Yes.   ? ?Agent: Please be advised that RX refills may take up to 3 business days. We ask that you follow-up with your pharmacy.  ?

## 2021-07-14 ENCOUNTER — Other Ambulatory Visit: Payer: Self-pay

## 2021-09-21 ENCOUNTER — Ambulatory Visit: Payer: Self-pay | Attending: Internal Medicine | Admitting: Physician Assistant

## 2021-09-21 ENCOUNTER — Other Ambulatory Visit: Payer: Self-pay | Admitting: Physician Assistant

## 2021-09-21 ENCOUNTER — Encounter: Payer: Self-pay | Admitting: Physician Assistant

## 2021-09-21 ENCOUNTER — Ambulatory Visit: Payer: Self-pay | Admitting: Internal Medicine

## 2021-09-21 VITALS — BP 134/80 | HR 100 | Temp 98.1°F | Ht 63.0 in | Wt 214.4 lb

## 2021-09-21 DIAGNOSIS — E1169 Type 2 diabetes mellitus with other specified complication: Secondary | ICD-10-CM

## 2021-09-21 DIAGNOSIS — E785 Hyperlipidemia, unspecified: Secondary | ICD-10-CM

## 2021-09-21 DIAGNOSIS — K219 Gastro-esophageal reflux disease without esophagitis: Secondary | ICD-10-CM

## 2021-09-21 DIAGNOSIS — E669 Obesity, unspecified: Secondary | ICD-10-CM

## 2021-09-21 DIAGNOSIS — E1159 Type 2 diabetes mellitus with other circulatory complications: Secondary | ICD-10-CM

## 2021-09-21 DIAGNOSIS — F411 Generalized anxiety disorder: Secondary | ICD-10-CM

## 2021-09-21 DIAGNOSIS — Z6837 Body mass index (BMI) 37.0-37.9, adult: Secondary | ICD-10-CM

## 2021-09-21 DIAGNOSIS — J301 Allergic rhinitis due to pollen: Secondary | ICD-10-CM

## 2021-09-21 DIAGNOSIS — I152 Hypertension secondary to endocrine disorders: Secondary | ICD-10-CM

## 2021-09-21 LAB — GLUCOSE, POCT (MANUAL RESULT ENTRY): POC Glucose: 162 mg/dl — AB (ref 70–99)

## 2021-09-21 LAB — POCT GLYCOSYLATED HEMOGLOBIN (HGB A1C): Hemoglobin A1C: 7.5 % — AB (ref 4.0–5.6)

## 2021-09-21 MED ORDER — ESCITALOPRAM OXALATE 10 MG PO TABS: ORAL_TABLET | ORAL | 1 refills | Status: DC

## 2021-09-21 MED ORDER — METFORMIN HCL 500 MG PO TABS: 500.0000 mg | ORAL_TABLET | Freq: Two times a day (BID) | ORAL | 1 refills | Status: DC

## 2021-09-21 MED ORDER — DAPAGLIFLOZIN PROPANEDIOL 5 MG PO TABS: 5.0000 mg | ORAL_TABLET | Freq: Every day | ORAL | 1 refills | Status: DC

## 2021-09-21 MED ORDER — LOSARTAN POTASSIUM 100 MG PO TABS: 100.0000 mg | ORAL_TABLET | Freq: Every day | ORAL | 1 refills | Status: DC

## 2021-09-21 MED ORDER — PANTOPRAZOLE SODIUM 40 MG PO TBEC: 40.0000 mg | DELAYED_RELEASE_TABLET | Freq: Every day | ORAL | 3 refills | Status: DC

## 2021-09-21 MED ORDER — ESCITALOPRAM OXALATE 10 MG PO TABS: ORAL_TABLET | ORAL | 1 refills | Status: AC

## 2021-09-21 MED ORDER — PANTOPRAZOLE SODIUM 40 MG PO TBEC: 40.0000 mg | DELAYED_RELEASE_TABLET | Freq: Every day | ORAL | 3 refills | Status: AC

## 2021-09-21 MED ORDER — ATORVASTATIN CALCIUM 10 MG PO TABS: 10.0000 mg | ORAL_TABLET | Freq: Every day | ORAL | 3 refills | Status: AC

## 2021-09-21 MED ORDER — FLUTICASONE PROPIONATE 50 MCG/ACT NA SUSP: 1.0000 | Freq: Every day | NASAL | 2 refills | Status: AC | PRN

## 2021-09-21 MED ORDER — ATORVASTATIN CALCIUM 10 MG PO TABS: 10.0000 mg | ORAL_TABLET | Freq: Every day | ORAL | 3 refills | Status: DC

## 2021-09-21 NOTE — Progress Notes (Signed)
Patient ID: Dawn Ray, female   DOB: Jul 20, 1985, 36 y.o.   MRN: [id number]   Dawn Ray, is a 36 y.o. female  UMP:[id number]  [id number]  DOB - 10-20-1985  Chief Complaint  Patient presents with   Diabetes   Medication Refill       Subjective:   Dawn Ray is a 36 y.o. female here today for med RF.  She has been out of meds for about 1 months.  She has been getting care in [place] but now back here.  They had titrated her losartan up to 100mg  and she took her BF medicine.    No problems updated.  ALLERGIES: No Known Allergies  PAST MEDICAL HISTORY: Past Medical History:  Diagnosis Date   Chronic constipation    Diabetes mellitus without complication (HCC)    GERD (gastroesophageal reflux disease)    High cholesterol    Hypertension     MEDICATIONS AT HOME: Prior to Admission medications   Medication Sig Start Date End Date Taking? Authorizing Provider  losartan (COZAAR) 100 MG tablet Take 1 tablet (100 mg total) by mouth daily. 09/21/21  Yes 09/23/21 M, PA-C  metFORMIN (GLUCOPHAGE) 500 MG tablet Take 1 tablet (500 mg total) by mouth 2 (two) times daily with a meal. 09/21/21  Yes Nicolaus Andel M, PA-C  atorvastatin (LIPITOR) 10 MG tablet Take 1 tablet (10 mg total) by mouth daily. 09/21/21   09/23/21, PA-C  dapagliflozin propanediol (FARXIGA) 5 MG TABS tablet Take 1 tablet (5 mg total) by mouth daily before breakfast. 09/21/21   09/23/21, PA-C  escitalopram (LEXAPRO) 10 MG tablet escitalopram 10 mg tablet  TAKE 1 TABLET BY MOUTH EVERY DAY 09/21/21   09/23/21 M, PA-C  fluticasone (FLONASE) 50 MCG/ACT nasal spray Place 1 spray into both nostrils daily as needed for allergies or rhinitis. Patient not taking: Reported on 09/21/2021 09/23/20   09/25/20, MD  loratadine (CLARITIN) 10 MG tablet Take 1 tablet (10 mg total) by mouth daily. Patient not taking: Reported on 09/21/2021 09/23/20   09/25/20, MD   pantoprazole (PROTONIX) 40 MG tablet Take 1 tablet (40 mg total) by mouth daily. 09/21/21   Sajjad Honea, 09/23/21, PA-C    ROS: Neg HEENT Neg resp Neg cardiac Neg GI Neg GU Neg MS Neg psych Neg neuro  Objective:   Vitals:   09/21/21 1538 09/21/21 1548  BP: (!) 153/94 134/80  Pulse: 100   Temp: 98.1 F (36.7 C)   TempSrc: Oral   SpO2: 98%   Weight: 214 lb 6.4 oz (97.3 kg)   Height: 5\' 3"  (1.6 m)    Exam General appearance : Awake, alert, not in any distress. Speech Clear. Not toxic looking HEENT: Atraumatic and NormocephalicNeck: Supple, no JVD. No cervical lymphadenopathy.  Chest: Good air entry bilaterally, CTAB.  No rales/rhonchi/wheezing CVS: S1 S2 regular, no murmurs.  Extremities: B/L Lower Ext shows no edema, both legs are warm to touch Neurology: Awake alert, and oriented X 3, CN II-XII intact, Non focal Skin: No Rash  Data Review Lab Results  Component Value Date   HGBA1C 7.5 (A) 09/21/2021   HGBA1C 6.6 09/23/2020   HGBA1C 6.8 (A) 08/18/2019    Assessment & Plan   1. Type 2 diabetes mellitus with obesity (HCC) Uncontrolled-resume farxiga and add metformin - Glucose (CBG) - POCT glycosylated hemoglobin (Hb A1C) - Comprehensive metabolic panel - dapagliflozin propanediol (FARXIGA) 5 MG TABS tablet; Take 1 tablet (5 mg  total) by mouth daily before breakfast.  Dispense: 90 tablet; Refill: 1 - metFORMIN (GLUCOPHAGE) 500 MG tablet; Take 1 tablet (500 mg total) by mouth 2 (two) times daily with a meal.  Dispense: 90 tablet; Refill: 1  2. Hypertension associated with diabetes (HCC) Controlled-continue - Comprehensive metabolic panel - CBC with Differential/Platelet - losartan (COZAAR) 100 MG tablet; Take 1 tablet (100 mg total) by mouth daily.  Dispense: 90 tablet; Refill: 1  3. Hyperlipidemia associated with type 2 diabetes mellitus (HCC) Out of meds - Lipid panel - Comprehensive metabolic panel - atorvastatin (LIPITOR) 10 MG tablet; Take 1 tablet (10 mg  total) by mouth daily.  Dispense: 90 tablet; Refill: 3  4. GAD (generalized anxiety disorder) Stable. - escitalopram (LEXAPRO) 10 MG tablet; escitalopram 10 mg tablet  TAKE 1 TABLET BY MOUTH EVERY DAY  Dispense: 90 tablet; Refill: 1  5. Gastroesophageal reflux disease - pantoprazole (PROTONIX) 40 MG tablet; Take 1 tablet (40 mg total) by mouth daily.  Dispense: 90 tablet; Refill: 3    Return in about 4 months (around 01/22/2022) for with PCP for chronic conditions.  The patient was given clear instructions to go to ER or return to medical center if symptoms don't improve, worsen or new problems develop. The patient verbalized understanding. The patient was told to call to get lab results if they haven't heard anything in the next week.      Georgian Co, PA-C [facility] [place], [place] [phone]   09/21/2021, 3:56 PM

## 2021-09-21 NOTE — Patient Instructions (Addendum)
Your A1C is 7.5 today.  I am going to add low dose metformin to take once daily with food along with farxiga

## 2021-09-22 ENCOUNTER — Other Ambulatory Visit: Payer: Self-pay | Admitting: Internal Medicine

## 2021-09-22 DIAGNOSIS — E1169 Type 2 diabetes mellitus with other specified complication: Secondary | ICD-10-CM

## 2021-09-22 DIAGNOSIS — E1159 Type 2 diabetes mellitus with other circulatory complications: Secondary | ICD-10-CM

## 2021-09-22 LAB — CBC WITH DIFFERENTIAL/PLATELET
Basophils Absolute: 0 10*3/uL (ref 0.0–0.2)
Basos: 0 %
EOS (ABSOLUTE): 0.2 10*3/uL (ref 0.0–0.4)
Eos: 3 %
Hematocrit: 43.5 % (ref 34.0–46.6)
Hemoglobin: 14.8 g/dL (ref 11.1–15.9)
Immature Grans (Abs): 0.1 10*3/uL (ref 0.0–0.1)
Immature Granulocytes: 1 %
Lymphocytes Absolute: 3.5 10*3/uL — ABNORMAL HIGH (ref 0.7–3.1)
Lymphs: 39 %
MCH: 30.7 pg (ref 26.6–33.0)
MCHC: 34 g/dL (ref 31.5–35.7)
MCV: 90 fL (ref 79–97)
Monocytes Absolute: 0.6 10*3/uL (ref 0.1–0.9)
Monocytes: 6 %
Neutrophils Absolute: 4.6 10*3/uL (ref 1.4–7.0)
Neutrophils: 51 %
Platelets: 274 10*3/uL (ref 150–450)
RBC: 4.82 x10E6/uL (ref 3.77–5.28)
RDW: 11.8 % (ref 11.7–15.4)
WBC: 9.1 10*3/uL (ref 3.4–10.8)

## 2021-09-22 LAB — COMPREHENSIVE METABOLIC PANEL
ALT: 92 IU/L — ABNORMAL HIGH (ref 0–32)
AST: 65 IU/L — ABNORMAL HIGH (ref 0–40)
Albumin/Globulin Ratio: 1.5 (ref 1.2–2.2)
Albumin: 4.7 g/dL (ref 3.8–4.8)
Alkaline Phosphatase: 62 IU/L (ref 44–121)
BUN/Creatinine Ratio: 15 (ref 9–23)
BUN: 11 mg/dL (ref 6–20)
Bilirubin Total: 0.3 mg/dL (ref 0.0–1.2)
CO2: 23 mmol/L (ref 20–29)
Calcium: 9.6 mg/dL (ref 8.7–10.2)
Chloride: 101 mmol/L (ref 96–106)
Creatinine, Ser: 0.72 mg/dL (ref 0.57–1.00)
Globulin, Total: 3.1 g/dL (ref 1.5–4.5)
Glucose: 136 mg/dL — ABNORMAL HIGH (ref 70–99)
Potassium: 4.2 mmol/L (ref 3.5–5.2)
Sodium: 139 mmol/L (ref 134–144)
Total Protein: 7.8 g/dL (ref 6.0–8.5)
eGFR: 110 mL/min/{1.73_m2} (ref >59)

## 2021-09-22 LAB — LIPID PANEL
Chol/HDL Ratio: 8.1 ratio — ABNORMAL HIGH (ref 0.0–4.4)
Cholesterol, Total: 243 mg/dL — ABNORMAL HIGH (ref 100–199)
HDL: 30 mg/dL — ABNORMAL LOW (ref >39)
LDL Chol Calc (NIH): 140 mg/dL — ABNORMAL HIGH (ref 0–99)
Triglycerides: 395 mg/dL — ABNORMAL HIGH (ref 0–149)
VLDL Cholesterol Cal: 73 mg/dL — ABNORMAL HIGH (ref 5–40)

## 2021-09-22 MED ORDER — LOSARTAN POTASSIUM 100 MG PO TABS: 100.0000 mg | ORAL_TABLET | Freq: Every day | ORAL | 1 refills | Status: DC

## 2021-09-22 MED ORDER — DAPAGLIFLOZIN PROPANEDIOL 5 MG PO TABS: 5.0000 mg | ORAL_TABLET | Freq: Every day | ORAL | 1 refills | Status: AC

## 2021-09-22 NOTE — Telephone Encounter (Signed)
Pt also mentioned if she could get 30 day refills instead of 90, due to her insurance.

## 2021-09-22 NOTE — Telephone Encounter (Signed)
Medication Refill - Medication:  dapagliflozin propanediol (FARXIGA) 5 MG TABS tablet  losartan (COZAAR) 100 MG tablet   Has the patient contacted their pharmacy? Yes.    *Contact PCP- Pharmacy didn't receive it*  Preferred Pharmacy (with phone number or street name):  [facility] [address], [place] - [address]  [address] [place] [address]  Phone:  [phone]  Fax:  [phone]   Has the patient been seen for an appointment in the last year OR does the patient have an upcoming appointment? Yes.    Agent: Please be advised that RX refills may take up to 3 business days. We ask that you follow-up with your pharmacy.

## 2021-09-22 NOTE — Telephone Encounter (Signed)
[facility] called and spoke to [place], [facility] about the refill(s) Farxiga and Losartan requested. Advised both were sent on 09/21/21 #90/1 refill(s). She says they never received those. I asked about the 30 day, she says they will run it as it is for 90 and if not covered they will back it down to 30 and fill it like that. I advised we will resend the medications.

## 2021-09-22 NOTE — Telephone Encounter (Signed)
Resending message, prescriptions were no received  Requested Prescriptions  Pending Prescriptions Disp Refills  . losartan (COZAAR) 100 MG tablet 90 tablet 1    Sig: Take 1 tablet (100 mg total) by mouth daily.     Cardiovascular:  Angiotensin Receptor Blockers Passed - 09/22/2021 10:29 AM      Passed - Cr in normal range and within 180 days    Creatinine, Ser  Date Value Ref Range Status  09/21/2021 0.72 0.57 - 1.00 mg/dL Final         Passed - K in normal range and within 180 days    Potassium  Date Value Ref Range Status  09/21/2021 4.2 3.5 - 5.2 mmol/L Final         Passed - Patient is not pregnant      Passed - Last BP in normal range    BP Readings from Last 1 Encounters:  09/21/21 134/80         Passed - Valid encounter within last 6 months    Recent Outpatient Visits          Yesterday Type 2 diabetes mellitus with obesity [facility])   [place], [place], [place]   12 months ago Type 2 diabetes mellitus with obesity ([place])   [place] Ladell Pier, MD   2 years ago Acute upper respiratory infection   [facility], Delfino Lovett, NP   2 years ago Type 2 diabetes mellitus without complication, without long-term current use of insulin ([place])   [facility], Delfino Lovett, NP   2 years ago Type 2 diabetes mellitus without complication, without long-term current use of insulin ([place])   [facility], [place], NP             . dapagliflozin propanediol (FARXIGA) 5 MG TABS tablet 90 tablet 1    Sig: Take 1 tablet (5 mg total) by mouth daily before breakfast.     Endocrinology:  Diabetes - SGLT2 Inhibitors Passed - 09/22/2021 10:29 AM      Passed - Cr in normal range and within 360 days    Creatinine, Ser  Date Value Ref Range Status  09/21/2021 0.72 0.57 - 1.00 mg/dL Final         Passed - HBA1C is between 0 and 7.9 and within 180 days    Hemoglobin A1C   Date Value Ref Range Status  09/21/2021 7.5 (A) 4.0 - 5.6 % Final   HbA1c, POC (controlled diabetic range)  Date Value Ref Range Status  09/23/2020 6.6 0.0 - 7.0 % Final         Passed - eGFR in normal range and within 360 days    GFR calc Af Amer  Date Value Ref Range Status  08/18/2019 118 >59 mL/min/1.73 Final   GFR, Estimated  Date Value Ref Range Status  09/25/2020 >60 >60 mL/min Final    Comment:    (NOTE) Calculated using the CKD-EPI Creatinine Equation (2021)    eGFR  Date Value Ref Range Status  09/21/2021 110 >59 mL/min/1.73 Final         Passed - Valid encounter within last 6 months    Recent Outpatient Visits          Yesterday Type 2 diabetes mellitus with obesity [facility])   [place], [place], [place]   12 months ago Type 2 diabetes mellitus with  obesity [facility])   [place] Karle Plumber B, MD   2 years ago Acute upper respiratory infection   [facility], [place], NP   2 years ago Type 2 diabetes mellitus without complication, without long-term current use of insulin [facility])   [facility], Delfino Lovett, NP   2 years ago Type 2 diabetes mellitus without complication, without long-term current use of insulin [facility])   [facility], Delfino Lovett, NP

## 2021-10-26 ENCOUNTER — Other Ambulatory Visit: Payer: Self-pay | Admitting: Internal Medicine

## 2021-10-26 DIAGNOSIS — E1169 Type 2 diabetes mellitus with other specified complication: Secondary | ICD-10-CM

## 2021-10-26 MED ORDER — METFORMIN HCL 500 MG PO TABS: 500.0000 mg | ORAL_TABLET | Freq: Two times a day (BID) | ORAL | 0 refills | Status: AC

## 2021-10-26 NOTE — Telephone Encounter (Signed)
Requested medication (s) are due for refill today: For review.  Requested medication (s) are on the active medication list: yes    Last refill: 09/21/21 #90  1 refill  Future visit scheduled yes 02/01/22  Notes to clinic:   Pt was prescribed Farxiga 09/21/21 but does not have insurance and could not afford the cost / she asked if metformin can be called in instead / Rx shows metformin was called in last month but she stated the pharmacy advised they didn't have an Rx for it / please advise    Requested Prescriptions  Pending Prescriptions Disp Refills   metFORMIN (GLUCOPHAGE) 500 MG tablet 90 tablet 1    Sig: Take 1 tablet (500 mg total) by mouth 2 (two) times daily with a meal.     Endocrinology:  Diabetes - Biguanides Failed - 10/26/2021 11:08 AM      Failed - B12 Level in normal range and within 720 days    No results found for: "VITAMINB12"       Passed - Cr in normal range and within 360 days    Creatinine, Ser  Date Value Ref Range Status  09/21/2021 0.72 0.57 - 1.00 mg/dL Final         Passed - HBA1C is between 0 and 7.9 and within 180 days    Hemoglobin A1C  Date Value Ref Range Status  09/21/2021 7.5 (A) 4.0 - 5.6 % Final   HbA1c, POC (controlled diabetic range)  Date Value Ref Range Status  09/23/2020 6.6 0.0 - 7.0 % Final         Passed - eGFR in normal range and within 360 days    GFR calc Af Amer  Date Value Ref Range Status  08/18/2019 118 >59 mL/min/1.73 Final   GFR, Estimated  Date Value Ref Range Status  09/25/2020 >60 >60 mL/min Final    Comment:    (NOTE) Calculated using the CKD-EPI Creatinine Equation (2021)    eGFR  Date Value Ref Range Status  09/21/2021 110 >59 mL/min/1.73 Final         Passed - Valid encounter within last 6 months    Recent Outpatient Visits           1 month ago Type 2 diabetes mellitus with obesity [facility])   [place], [place], [place]   1 year ago Type 2 diabetes mellitus with  obesity ([place])   [place], Deborah B, MD   2 years ago Acute upper respiratory infection   [facility], [place], NP   2 years ago Type 2 diabetes mellitus without complication, without long-term current use of insulin ([place])   [facility], [place], NP   2 years ago Type 2 diabetes mellitus without complication, without long-term current use of insulin [facility])   [facility], Delfino Lovett, NP       Future Appointments             In 3 months Ladell Pier, MD [place] - CBC within normal limits and completed in the last 12 months    WBC  Date Value Ref Range Status  09/21/2021 9.1 3.4 - 10.8 x10E3/uL Final  09/25/2020 11.1 (H) 4.0 - 10.5 K/uL Final   RBC  Date Value Ref Range Status  09/21/2021 4.82 3.77 -  5.28 x10E6/uL Final  09/25/2020 4.90 3.87 - 5.11 MIL/uL Final   Hemoglobin  Date Value Ref Range Status  09/21/2021 14.8 11.1 - 15.9 g/dL Final   Hematocrit  Date Value Ref Range Status  09/21/2021 43.5 34.0 - 46.6 % Final   MCHC  Date Value Ref Range Status  09/21/2021 34.0 31.5 - 35.7 g/dL Final  09/25/2020 34.0 30.0 - 36.0 g/dL Final   [facility]  Date Value Ref Range Status  09/21/2021 30.7 26.6 - 33.0 pg Final  09/25/2020 30.8 26.0 - 34.0 pg Final   MCV  Date Value Ref Range Status  09/21/2021 90 79 - 97 fL Final   No results found for: "PLTCOUNTKUC", "LABPLAT", "POCPLA" RDW  Date Value Ref Range Status  09/21/2021 11.8 11.7 - 15.4 % Final

## 2021-10-26 NOTE — Telephone Encounter (Signed)
Pt was prescribed Marcelline Deist but has no insurance and could afford the cost / she asked if metformin can be called in instead / Rx shows metformin was called in last month but she stated the pharmacy advised they didn't have an Rx for it / please advise   [facility] [id number] [facility], [place] - [address]  [address] [place] [address]  Phone:  [phone]  Fax:  [phone]

## 2022-02-01 ENCOUNTER — Ambulatory Visit: Payer: Self-pay | Admitting: Internal Medicine

## 2022-03-04 ENCOUNTER — Other Ambulatory Visit: Payer: Self-pay | Admitting: Physician Assistant

## 2022-03-04 DIAGNOSIS — I152 Hypertension secondary to endocrine disorders: Secondary | ICD-10-CM

## 2022-03-04 DIAGNOSIS — E1159 Type 2 diabetes mellitus with other circulatory complications: Secondary | ICD-10-CM

## 2022-05-08 ENCOUNTER — Other Ambulatory Visit (HOSPITAL_COMMUNITY): Payer: Self-pay
